# Patient Record
Sex: Female | Born: 1940 | Race: White | Hispanic: No | Smoking: Never smoker
Health system: Southern US, Community
[De-identification: ages and names within clinical notes are randomized; demographics above are authoritative.]

## PROBLEM LIST (undated history)

## (undated) DIAGNOSIS — T8859XA Other complications of anesthesia, initial encounter: Secondary | ICD-10-CM

## (undated) DIAGNOSIS — K5792 Diverticulitis of intestine, part unspecified, without perforation or abscess without bleeding: Secondary | ICD-10-CM

## (undated) DIAGNOSIS — I10 Essential (primary) hypertension: Secondary | ICD-10-CM

## (undated) DIAGNOSIS — T4145XA Adverse effect of unspecified anesthetic, initial encounter: Secondary | ICD-10-CM

## (undated) DIAGNOSIS — K219 Gastro-esophageal reflux disease without esophagitis: Secondary | ICD-10-CM

## (undated) DIAGNOSIS — E119 Type 2 diabetes mellitus without complications: Secondary | ICD-10-CM

## (undated) DIAGNOSIS — N189 Chronic kidney disease, unspecified: Secondary | ICD-10-CM

## (undated) DIAGNOSIS — E785 Hyperlipidemia, unspecified: Secondary | ICD-10-CM

## (undated) DIAGNOSIS — I340 Nonrheumatic mitral (valve) insufficiency: Secondary | ICD-10-CM

## (undated) DIAGNOSIS — I2510 Atherosclerotic heart disease of native coronary artery without angina pectoris: Secondary | ICD-10-CM

## (undated) DIAGNOSIS — R001 Bradycardia, unspecified: Secondary | ICD-10-CM

## (undated) DIAGNOSIS — I2699 Other pulmonary embolism without acute cor pulmonale: Secondary | ICD-10-CM

## (undated) DIAGNOSIS — I4891 Unspecified atrial fibrillation: Secondary | ICD-10-CM

## (undated) HISTORY — PX: ABDOMINAL HYSTERECTOMY: SHX81

## (undated) HISTORY — PX: CARDIAC CATHETERIZATION: SHX172

## (undated) HISTORY — PX: EYE SURGERY: SHX253

## (undated) HISTORY — PX: CORONARY ARTERY BYPASS GRAFT: SHX141

## (undated) HISTORY — PX: BREAST BIOPSY: SHX20

## (undated) HISTORY — PX: CARDIAC SURGERY: SHX584

## (undated) HISTORY — PX: BREAST EXCISIONAL BIOPSY: SUR124

## (undated) HISTORY — PX: CATARACT EXTRACTION W/ INTRAOCULAR LENS  IMPLANT, BILATERAL: SHX1307

## (undated) HISTORY — PX: CHOLECYSTECTOMY: SHX55

---

## 2003-10-16 ENCOUNTER — Other Ambulatory Visit: Payer: Self-pay

## 2003-10-27 ENCOUNTER — Other Ambulatory Visit: Payer: Self-pay

## 2004-11-20 ENCOUNTER — Ambulatory Visit: Payer: Self-pay | Admitting: Internal Medicine

## 2006-01-10 ENCOUNTER — Ambulatory Visit: Payer: Self-pay | Admitting: Internal Medicine

## 2006-01-17 ENCOUNTER — Ambulatory Visit: Payer: Self-pay | Admitting: Internal Medicine

## 2006-07-22 ENCOUNTER — Ambulatory Visit: Payer: Self-pay | Admitting: Internal Medicine

## 2006-10-23 ENCOUNTER — Emergency Department: Payer: Self-pay | Admitting: Emergency Medicine

## 2006-10-23 ENCOUNTER — Other Ambulatory Visit: Payer: Self-pay

## 2007-02-18 ENCOUNTER — Ambulatory Visit: Payer: Self-pay | Admitting: Internal Medicine

## 2008-02-19 ENCOUNTER — Ambulatory Visit: Payer: Self-pay | Admitting: Internal Medicine

## 2008-06-05 IMAGING — CR DG CHEST 2V
1 series · 2 of 2 positions shown · non-contrast
Comparison: none

REASON FOR EXAM: rapid heart rate  rm 15
COMMENTS:

PROCEDURE:     DXR - DXR CHEST PA (OR AP) AND LATERAL  - October 23, 2006 [DATE]
RESULT:     The lungs are adequately inflated. There is no focal infiltrate.
The heart and pulmonary vascularity are within the limits of normal. There
is no pleural effusion.

[Series 1: view not recorded · 0.17mm/px · 2 of 2 slices shown]
[im 1/2]
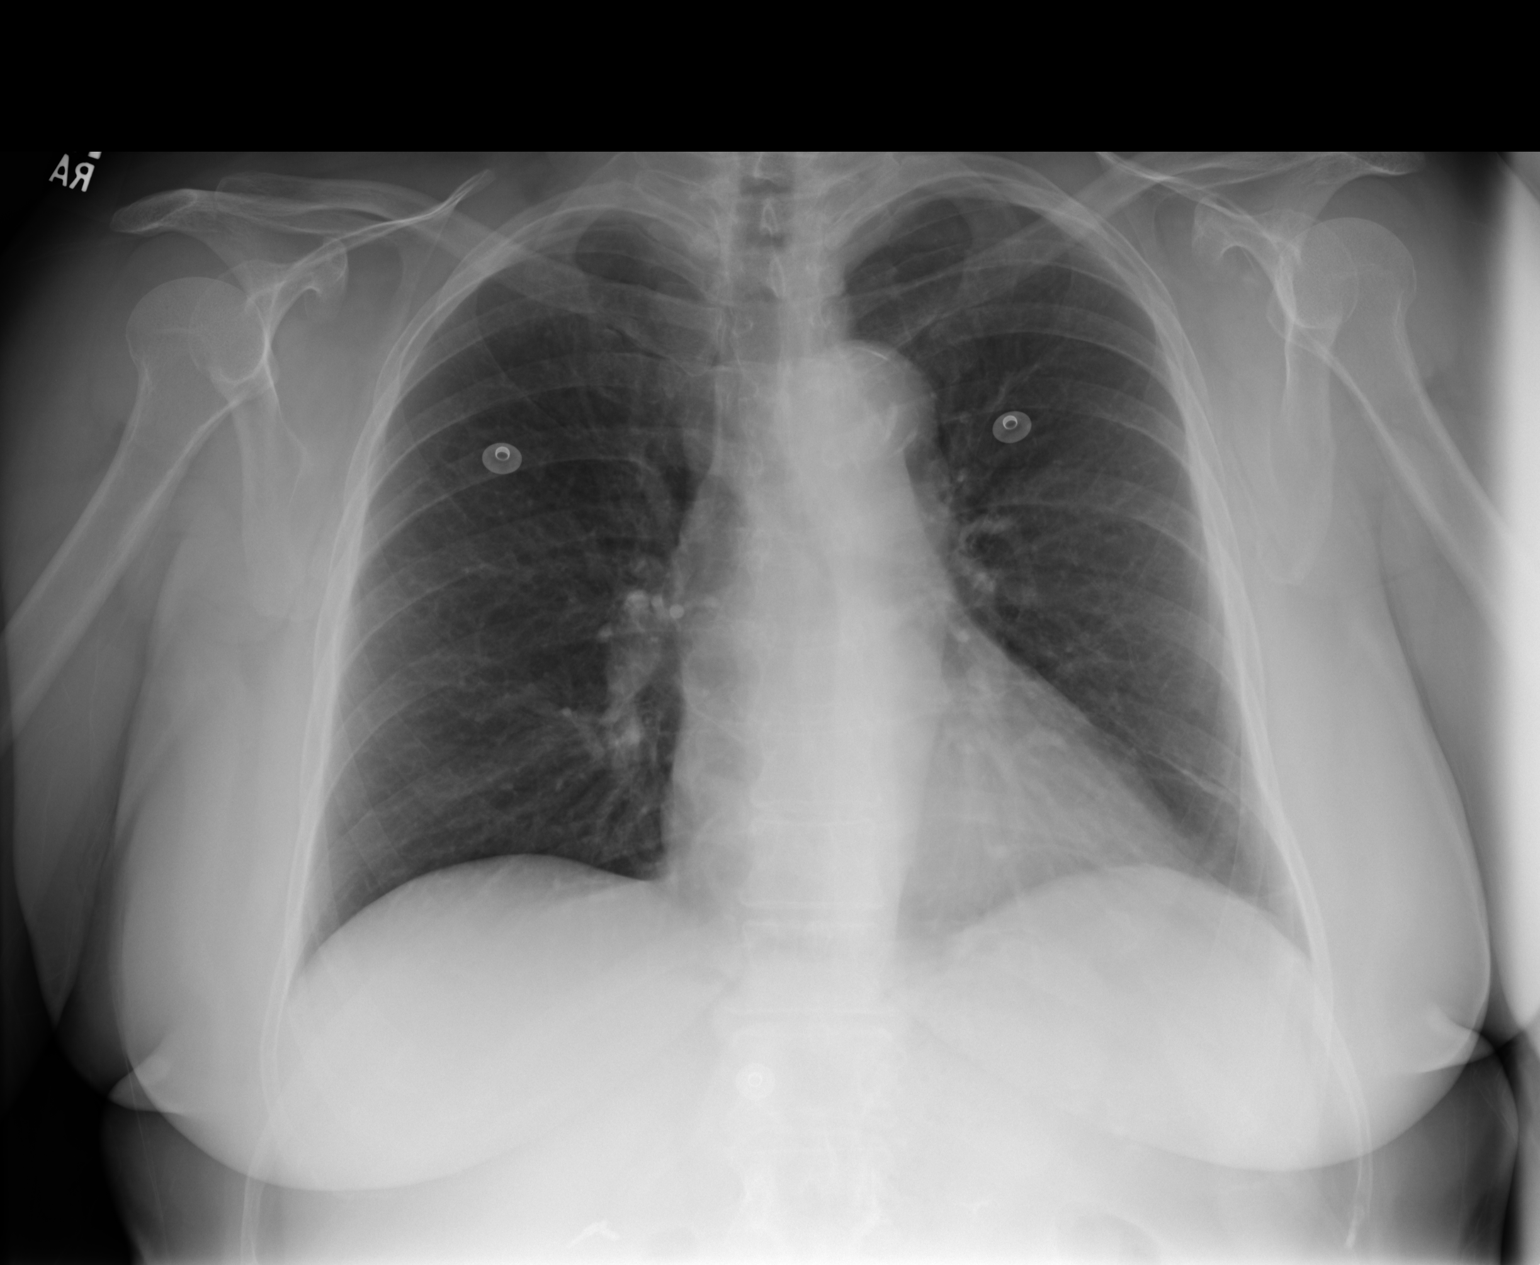
[im 2/2]
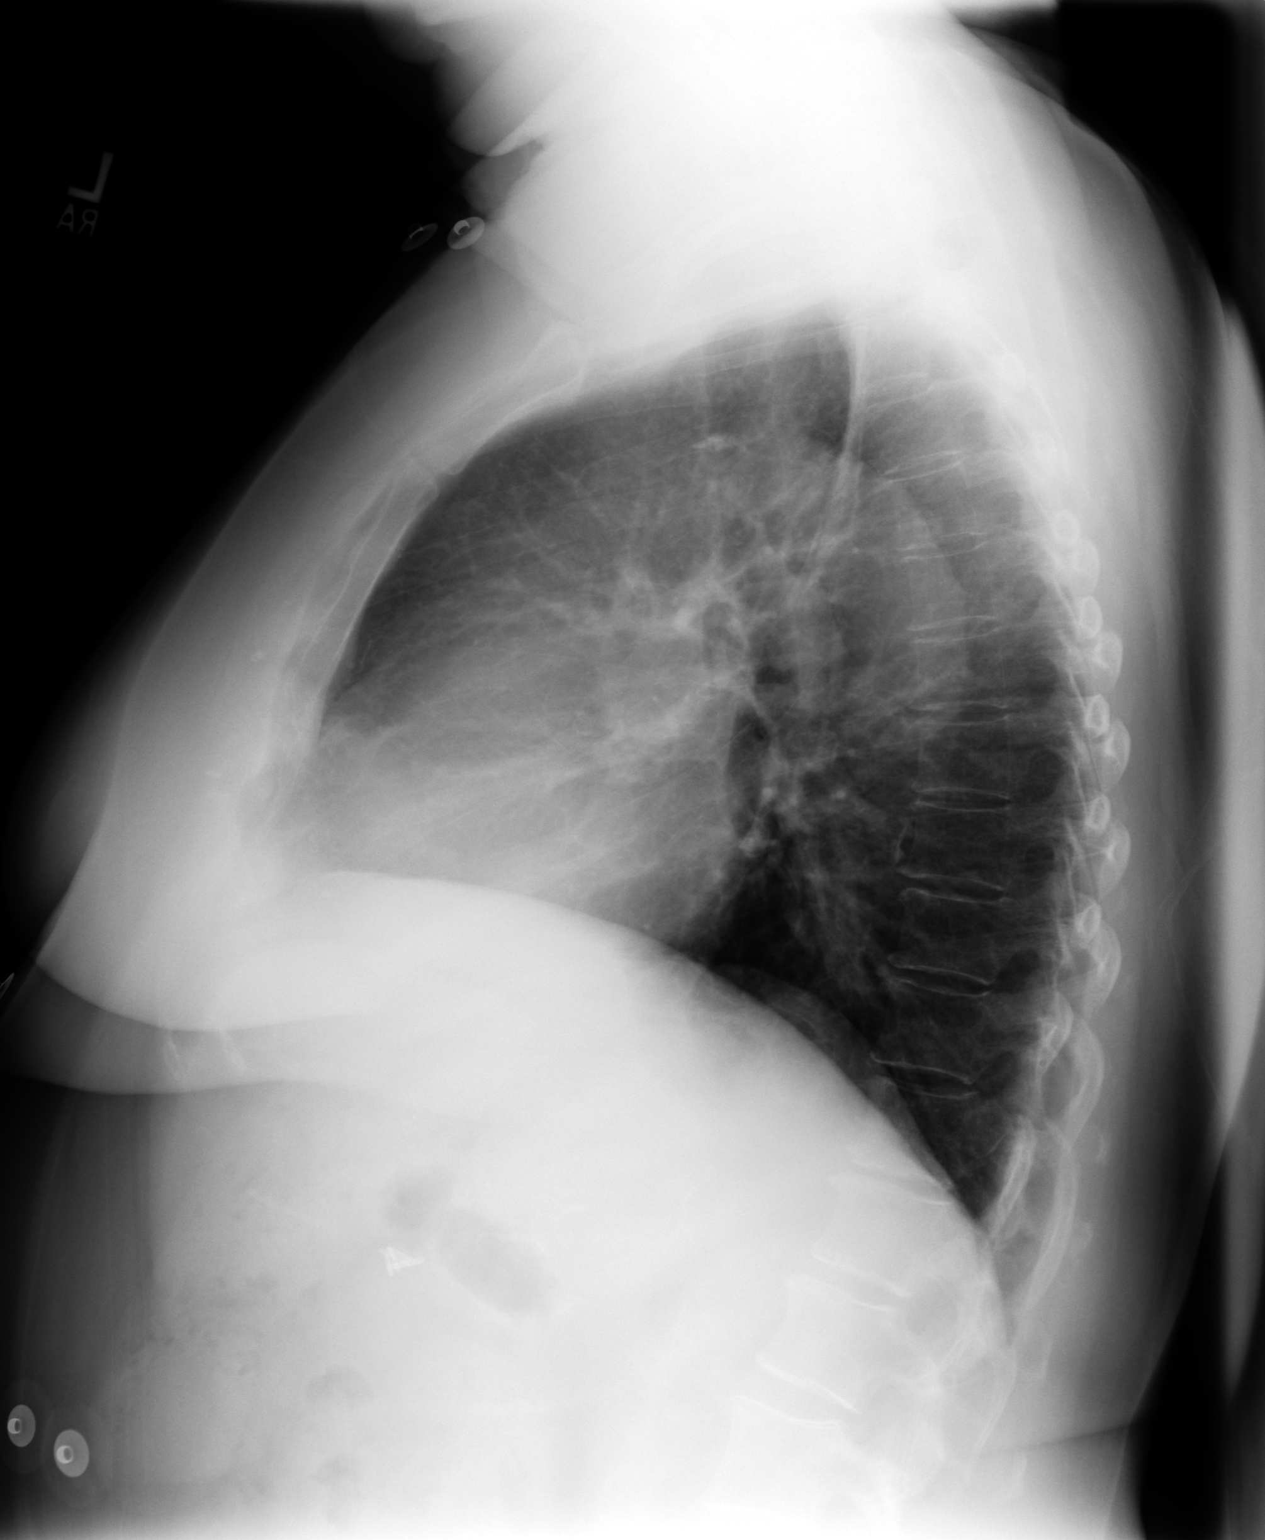

[2 of 2 positions shown; findings below may reference images not displayed]

IMPRESSION: 1)I do not see evidence of acute cardiopulmonary disease.  There is no overt
evidence of CHF.

## 2009-02-24 ENCOUNTER — Ambulatory Visit: Payer: Self-pay | Admitting: Internal Medicine

## 2010-02-27 ENCOUNTER — Ambulatory Visit: Payer: Self-pay | Admitting: Internal Medicine

## 2010-09-17 DIAGNOSIS — I2699 Other pulmonary embolism without acute cor pulmonale: Secondary | ICD-10-CM

## 2010-09-17 HISTORY — DX: Other pulmonary embolism without acute cor pulmonale: I26.99

## 2011-07-23 ENCOUNTER — Inpatient Hospital Stay: Payer: Self-pay | Admitting: Internal Medicine

## 2011-08-10 HISTORY — PX: CORONARY ARTERY BYPASS GRAFT: SHX141

## 2011-09-19 ENCOUNTER — Encounter: Payer: Self-pay | Admitting: Internal Medicine

## 2011-10-19 ENCOUNTER — Encounter: Payer: Self-pay | Admitting: Internal Medicine

## 2011-11-16 ENCOUNTER — Encounter: Payer: Self-pay | Admitting: Internal Medicine

## 2012-03-02 ENCOUNTER — Inpatient Hospital Stay: Payer: Self-pay | Admitting: Internal Medicine

## 2012-03-02 LAB — COMPREHENSIVE METABOLIC PANEL
Albumin: 3.3 g/dL — ABNORMAL LOW (ref 3.4–5.0)
Alkaline Phosphatase: 75 U/L (ref 50–136)
Anion Gap: 10 (ref 7–16)
Chloride: 106 mmol/L (ref 98–107)
Creatinine: 0.54 mg/dL — ABNORMAL LOW (ref 0.60–1.30)
EGFR (African American): >60
EGFR (Non-African Amer.): >60
Glucose: 107 mg/dL — ABNORMAL HIGH (ref 65–99)
Osmolality: 281 (ref 275–301)
SGPT (ALT): 17 U/L
Sodium: 141 mmol/L (ref 136–145)

## 2012-03-02 LAB — PROTIME-INR: INR: 1.5

## 2012-03-02 LAB — APTT: Activated PTT: 40.9 secs — ABNORMAL HIGH (ref 23.6–35.9)

## 2012-03-02 LAB — CBC: RBC: 3.77 10*6/uL — ABNORMAL LOW (ref 3.80–5.20)

## 2012-03-02 LAB — TROPONIN I: Troponin-I: <0.02 ng/mL

## 2012-03-03 ENCOUNTER — Observation Stay: Payer: Self-pay | Admitting: Internal Medicine

## 2012-03-03 LAB — CBC
HCT: 36.8 % (ref 35.0–47.0)
HGB: 12.7 g/dL (ref 12.0–16.0)
MCH: 32.5 pg (ref 26.0–34.0)
MCHC: 34.5 g/dL (ref 32.0–36.0)
MCV: 94 fL (ref 80–100)
Platelet: 235 10*3/uL (ref 150–440)
RBC: 3.92 10*6/uL (ref 3.80–5.20)
RDW: 12.9 % (ref 11.5–14.5)
WBC: 6.3 10*3/uL (ref 3.6–11.0)

## 2012-03-03 LAB — COMPREHENSIVE METABOLIC PANEL
BUN: 11 mg/dL (ref 7–18)
Bilirubin,Total: 0.8 mg/dL (ref 0.2–1.0)
Calcium, Total: 8.6 mg/dL (ref 8.5–10.1)
Co2: 24 mmol/L (ref 21–32)
EGFR (Non-African Amer.): >60
Osmolality: 284 (ref 275–301)
SGPT (ALT): 20 U/L

## 2012-03-03 LAB — CK TOTAL AND CKMB (NOT AT ARMC): CK, Total: 41 U/L (ref 21–215)

## 2012-03-03 LAB — MAGNESIUM: Magnesium: 1.9 mg/dL

## 2012-03-03 LAB — PROTIME-INR: Prothrombin Time: 19.9 secs — ABNORMAL HIGH (ref 11.5–14.7)

## 2012-03-03 LAB — TROPONIN I: Troponin-I: <0.02 ng/mL

## 2012-03-04 DIAGNOSIS — I4891 Unspecified atrial fibrillation: Secondary | ICD-10-CM

## 2012-03-04 LAB — URINALYSIS, COMPLETE
Blood: NEGATIVE
Glucose,UR: NEGATIVE mg/dL (ref 0–75)
Nitrite: NEGATIVE
Ph: 7 (ref 4.5–8.0)
Specific Gravity: 1.009 (ref 1.003–1.030)
Squamous Epithelial: 1
WBC UR: 1 /HPF (ref 0–5)

## 2012-03-04 LAB — BASIC METABOLIC PANEL
Anion Gap: 7 (ref 7–16)
Chloride: 111 mmol/L — ABNORMAL HIGH (ref 98–107)
Co2: 24 mmol/L (ref 21–32)
Creatinine: 0.66 mg/dL (ref 0.60–1.30)
EGFR (Non-African Amer.): >60
Glucose: 107 mg/dL — ABNORMAL HIGH (ref 65–99)
Osmolality: 284 (ref 275–301)
Sodium: 142 mmol/L (ref 136–145)

## 2012-03-04 LAB — CBC WITH DIFFERENTIAL/PLATELET
Basophil #: 0.1 10*3/uL (ref 0.0–0.1)
Eosinophil %: 2.3 %
HCT: 34.2 % — ABNORMAL LOW (ref 35.0–47.0)
Lymphocyte #: 1.9 10*3/uL (ref 1.0–3.6)
MCH: 31.1 pg (ref 26.0–34.0)
Monocyte #: 0.7 x10 3/mm (ref 0.2–0.9)
Monocyte %: 10.2 %
Neutrophil %: 56.4 %
Platelet: 241 10*3/uL (ref 150–440)
RBC: 3.7 10*6/uL — ABNORMAL LOW (ref 3.80–5.20)
RDW: 13.2 % (ref 11.5–14.5)

## 2012-03-04 LAB — CK TOTAL AND CKMB (NOT AT ARMC)
CK, Total: 30 U/L (ref 21–215)
CK-MB: 0.7 ng/mL (ref 0.5–3.6)

## 2012-03-04 LAB — PROTIME-INR: Prothrombin Time: 23.6 secs — ABNORMAL HIGH (ref 11.5–14.7)

## 2012-03-19 ENCOUNTER — Ambulatory Visit: Payer: Self-pay | Admitting: Family Medicine

## 2012-04-02 DIAGNOSIS — I4891 Unspecified atrial fibrillation: Secondary | ICD-10-CM

## 2013-03-05 IMAGING — CR DG CHEST 1V PORT
1 series · 1 of 1 positions shown · non-contrast
Comparison: none

REASON FOR EXAM: cp
COMMENTS:

[view not recorded]
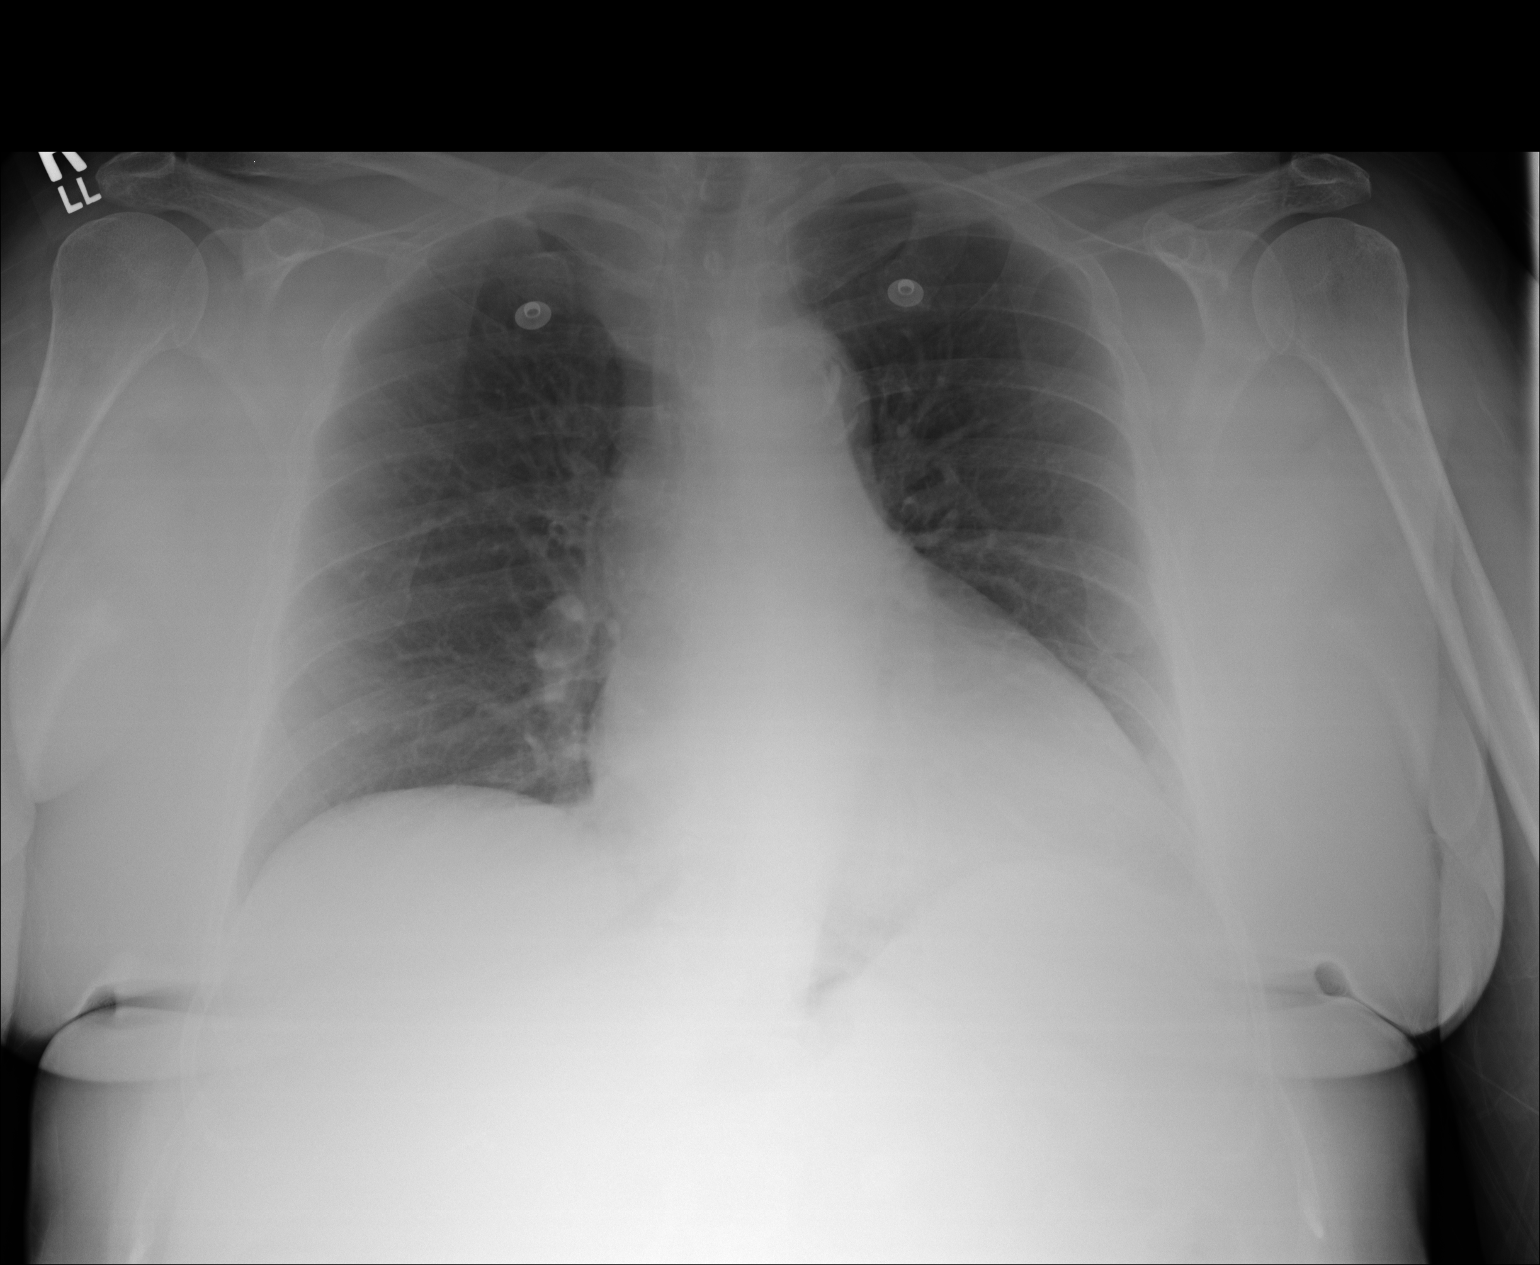

[1 of 1 positions shown; findings below may reference images not displayed]

PROCEDURE:     DXR - DXR PORTABLE CHEST SINGLE VIEW  - July 23, 2011  [DATE]

RESULT:     A lordotic projection is performed.

The lungs are clear. The heart and pulmonary vessels are normal. The bony
and mediastinal structures are unremarkable. There is no effusion. There is
no pneumothorax or evidence of congestive failure.
IMPRESSION: No acute cardiopulmonary disease..

## 2013-06-03 ENCOUNTER — Ambulatory Visit: Payer: Self-pay | Admitting: Family Medicine

## 2014-01-12 ENCOUNTER — Emergency Department: Payer: Self-pay | Admitting: Emergency Medicine

## 2014-01-12 LAB — COMPREHENSIVE METABOLIC PANEL
ALT: 26 U/L (ref 12–78)
Albumin: 3.7 g/dL (ref 3.4–5.0)
Alkaline Phosphatase: 71 U/L
Anion Gap: 8 (ref 7–16)
BUN: 14 mg/dL (ref 7–18)
Bilirubin,Total: 1.6 mg/dL — ABNORMAL HIGH (ref 0.2–1.0)
CALCIUM: 9.4 mg/dL (ref 8.5–10.1)
Chloride: 100 mmol/L (ref 98–107)
Co2: 30 mmol/L (ref 21–32)
Creatinine: 0.78 mg/dL (ref 0.60–1.30)
EGFR (African American): >60
GLUCOSE: 182 mg/dL — AB (ref 65–99)
Osmolality: 281 (ref 275–301)
POTASSIUM: 2.9 mmol/L — AB (ref 3.5–5.1)
SGOT(AST): 14 U/L — ABNORMAL LOW (ref 15–37)
Sodium: 138 mmol/L (ref 136–145)
TOTAL PROTEIN: 7.3 g/dL (ref 6.4–8.2)

## 2014-01-12 LAB — TROPONIN I
Troponin-I: <0.02 ng/mL
Troponin-I: <0.02 ng/mL

## 2014-01-12 LAB — CBC
HCT: 41.7 % (ref 35.0–47.0)
HGB: 14.3 g/dL (ref 12.0–16.0)
MCH: 32.2 pg (ref 26.0–34.0)
MCHC: 34.4 g/dL (ref 32.0–36.0)
MCV: 94 fL (ref 80–100)
PLATELETS: 204 10*3/uL (ref 150–440)
RBC: 4.45 10*6/uL (ref 3.80–5.20)
RDW: 13.8 % (ref 11.5–14.5)
WBC: 10.2 10*3/uL (ref 3.6–11.0)

## 2014-01-12 LAB — CK TOTAL AND CKMB (NOT AT ARMC)
CK, Total: 99 U/L
CK-MB: 1.1 ng/mL (ref 0.5–3.6)

## 2014-01-12 LAB — PROTIME-INR
INR: 1.7
PROTHROMBIN TIME: 20 s — AB (ref 11.5–14.7)

## 2014-06-29 ENCOUNTER — Ambulatory Visit: Payer: Self-pay | Admitting: Family Medicine

## 2014-10-02 ENCOUNTER — Emergency Department: Payer: Self-pay | Admitting: Emergency Medicine

## 2014-10-02 ENCOUNTER — Ambulatory Visit: Payer: Self-pay | Admitting: Family Medicine

## 2014-10-02 LAB — CBC
HCT: 41.3 % (ref 35.0–47.0)
HGB: 13.8 g/dL (ref 12.0–16.0)
MCH: 31.5 pg (ref 26.0–34.0)
MCHC: 33.5 g/dL (ref 32.0–36.0)
MCV: 94 fL (ref 80–100)
Platelet: 226 10*3/uL (ref 150–440)
RBC: 4.38 10*6/uL (ref 3.80–5.20)
RDW: 13.3 % (ref 11.5–14.5)
WBC: 7.5 10*3/uL (ref 3.6–11.0)

## 2014-10-02 LAB — PROTIME-INR
INR: 1.7
Prothrombin Time: 19.5 secs — ABNORMAL HIGH (ref 11.5–14.7)

## 2014-10-02 LAB — COMPREHENSIVE METABOLIC PANEL
ALBUMIN: 3.3 g/dL — AB (ref 3.4–5.0)
ANION GAP: 13 (ref 7–16)
Alkaline Phosphatase: 58 U/L
BILIRUBIN TOTAL: 0.9 mg/dL (ref 0.2–1.0)
BUN: 15 mg/dL (ref 7–18)
CHLORIDE: 103 mmol/L (ref 98–107)
CREATININE: 0.85 mg/dL (ref 0.60–1.30)
Calcium, Total: 8.9 mg/dL (ref 8.5–10.1)
Co2: 27 mmol/L (ref 21–32)
EGFR (African American): >60
EGFR (Non-African Amer.): >60
GLUCOSE: 145 mg/dL — AB (ref 65–99)
OSMOLALITY: 288 (ref 275–301)
POTASSIUM: 3.1 mmol/L — AB (ref 3.5–5.1)
SGOT(AST): 31 U/L (ref 15–37)
SGPT (ALT): 25 U/L
SODIUM: 143 mmol/L (ref 136–145)
Total Protein: 6.7 g/dL (ref 6.4–8.2)

## 2014-10-02 LAB — TROPONIN I

## 2015-01-09 NOTE — H&P (Signed)
PATIENT NAME:  [name], KILCREASE MR#:  [id number] DATE OF BIRTH:  1940-12-30  DATE OF ADMISSION:  03/04/2012  PRIMARY CARE PHYSICIAN: Dr. Hortencia Pilar REQUESTING PHYSICIAN:  Dr. Jasmine December   CHIEF COMPLAINT: Palpitations.   HISTORY OF PRESENT ILLNESS: The patient is a 74 year old female with a known history of hypertension, diabetes, and paroxysmal atrial fibrillation who is being admitted for rapid atrial fibrillation and palpitations. The patient was admitted yesterday for the same and was discharged today as she was stable enough, but as soon as she went home she felt excited and at the same time she started feeling palpitations. She took her afternoon medication in the form of metoprolol and warfarin as she was prescribed but it did not help. She could see her heart jumping out of her chest and was feeling continuous palpitations. She tried to contact Dr. Alveria Apley office but was unable to do so. The family was feeling uncomfortable keeping her home and called the Emergency Department as she tried to call admissions who referred her to the Emergency Department.  While in the ED, she was found to have potassium of 3.2 and she is being admitted for further evaluation and management as she did not feel safe to go back home like this.   PAST MEDICAL HISTORY:  1. Hypertension.  2. Diabetes.  3. Coronary artery disease, status post coronary artery bypass graft in November 2012.  4. Hyperlipidemia.  5. Paroxysmal atrial fibrillation.  6. 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   PAST SURGICAL HISTORY:  Hysterectomy.   FAMILY HISTORY: Mother had congestive heart failure, died at the age of  14. She also suffered from a stroke at age of 102. Father had valve replacement and coronary artery bypass graft.   SOCIAL HISTORY: She lives at home with her son. Her  husband resides in a local nursing home. She is a nonsmoker. No alcohol or drug use.   MEDICATIONS AT HOME: From recent discharge this morning are as follows:  1. Amlodipine 5 mg p.o. daily.  2. Aspirin 325 mg p.o. daily.  3. Multivitamin once daily.  4. Omega-3 fatty acid 2 tablets p.o. twice a day.  5. Quinapril 40 mg p.o. daily.  6. Metformin 500 mg p.o. daily.  7. Pravastatin 10 mg p.o. at bedtime.  8. Senokot once daily.  9. Metoprolol 50 mg p.o. b.i.d.  10. Warfarin 6 mg p.o. at bedtime.   REVIEW OF SYSTEMS: CONSTITUTIONAL: No fever, fatigue, weakness. EYES: No blurred or double vision. ENT: No tinnitus or ear pain. RESPIRATORY:  No cough, wheeze, or hemoptysis; CARDIOVASCULAR:  No chest pain, orthopnea, or edema. Positive for palpitations and atrial fibrillation. GI: No nausea, vomiting, or diarrhea. GU: No dysuria or hematuria. ENDOCRINE:  No polyuria or nocturia. HEME: No anemia or easy bruising. SKIN: No rash or lesion. MUSCULOSKELETAL: No arthritis or muscle cramps. NEUROLOGIC: No tingling, numbness, or weakness. PSYCHIATRIC: Positive for anxiety. No depression.   PHYSICAL EXAMINATION:  VITAL SIGNS: Temperature 97.8, heart rate 75 per minute, respirations 18 per minute, blood pressure 143/77 mmHg. She is saturating 98% on room air.   GENERAL:  The patient is a 74 year old female lying in the bed comfortably without any acute distress.   EYES: Pupils equal, round, reactive to light and accommodation. No scleral icterus. Extraocular muscles intact.   HENT: Head atraumatic, normocephalic. Oropharynx and nasopharynx clear.  NECK: Supple. No jugular venous distention. No thyroid enlargement or tenderness.   LUNGS: Clear to auscultation bilaterally. No wheezes, rales, rhonchi, or crepitation.   CARDIOVASCULAR: S1, S2 normal. No murmur, rubs, or gallop.  Chest wall shows coronary artery bypass grafting scar well healed.  ABDOMEN: Soft, nontender, nondistended. Bowel sounds  present. No organomegaly or masses.    EXTREMITIES: No pedal edema, cyanosis, or clubbing.   NEUROLOGIC: Nonfocal examination. Cranial nerves III through XII intact. Muscle strength 5/5. Extremity sensation intact.   PSYCH: The patient is oriented to time, place, and person.   SKIN: No obvious rash, lesion, or ulcer.  LABORATORY, DIAGNOSTIC, AND RADIOLOGICAL DATA: Normal BMP except potassium of 3.2. Normal liver function tests. Normal first set of cardiac enzymes. Normal TSH. Normal CBC. PT of 19.9, INR 1.6.   Earlier EKG showed heart rate of 140 with rapid atrial fibrillation from EMS. EKG here in the Emergency Department is showing normal sinus rhythm with a rate of 63 beats per minute. She converted herself on arrival.   IMPRESSION AND PLAN:  1. Rapid paroxysmal atrial fibrillation: Now rate is well controlled. She is back in normal sinus rhythm, but she does not feel comfortable going back home nor does her family, who feels it would be wise to observe her here.  She will be admitted for observation for now. We will continue metoprolol and Coumadin at current dosage. Consult cardiology for any further suggestions.   2. Diabetes: We will continue home medications. 3. Hypertension: Blood pressure is well controlled. We will continue home medication.  4. Hypokalemia: We will replete and recheck. The patient is already feeling somewhat better with potassium being replaced. We will recheck in the morning.  5. CODE STATUS: FULL CODE.       TOTAL TIME TAKING CARE OF THIS PATIENT: 45 minutes.     ____________________________ Lucina Mellow. Manuella Ghazi, MD vss:bjt D: 03/03/2012 23:10:01 ET T: 03/04/2012 09:07:33 ET JOB#: [id number]  cc: Darris Carachure S. Manuella Ghazi, MD, <Dictator> Kerin Perna, MD Corey Skains, MD Lucina Mellow [facility] MD ELECTRONICALLY SIGNED 03/04/2012 20:06

## 2015-01-09 NOTE — Discharge Summary (Signed)
PATIENT NAME:  [name], [name] MR#:  [id number] DATE OF BIRTH:  [birth year]  DATE OF ADMISSION:  03/03/2012 DATE OF DISCHARGE:  03/04/2012  PRESENTING COMPLAINT:  Palpitations.  DISCHARGE DIAGNOSES:  1. Recurrent atrial fibrillation, now in sinus rhythm, with history of paroxysmal atrial fibrillation, on Coumadin.  2. Hypertension.   CONDITION ON DISCHARGE: Fair. The patient is in sinus rhythm.   DISCHARGE MEDICATIONS:  1. Amlodipine 5 mg daily.  2. Metoprolol 50 mg twice a day. 3. Coumadin 6 mg orally daily.  4. Senokot 8.6 mg p.o. daily at bedtime.  5. Pravastatin 10 mg daily at bedtime.  6. Metformin 500 mg p.o. daily.  7. Quinapril 400 mg daily.  8. Omega-3 fatty acids 2 tablets twice a day. 9. Aspirin 325 mg daily.  10. Multivitamin p.o. daily.   DISCHARGE FOLLOWUP: Followup with Dr. Nehemiah Massed and Dr. Hoy Morn on your scheduled appointment. Get your PT and INR checked with your primary care on 03/06/2012, which is Thursday.   LABS: CBC within normal limits, except hemoglobin and hematocrit of 11.5 and 34.2. Comprehensive metabolic panel within normal limits. Cardiac enzymes remained negative. PT and INR is 23.6 and 2.1.   Urinalysis negative for urinary tract infection.   CONSULTANTS: Serafina Royals, MD - Cardiology.  BRIEF SUMMARY OF HOSPITAL COURSE:  1. [name] is a pleasant 74 year old Caucasian female with history of paroxysmal atrial fibrillation who was just discharged, however, she came back the same day with palpitations and chest pressure in the setting of rapid atrial fibrillation. She received IV Cardizem in the ER which resolved. Her cardiac enzymes were negative. She remained in sinus rhythm. Thereafter she was continued on metoprolol which was recently increased to 50 mg twice a day. She was seen by Dr. Nehemiah Massed and recommended to continue her metoprolol and Coumadin and see him as an outpatient. 2. Coronary artery disease status post coronary artery bypass  graft in November 2012. The patient denied any chest pain. Cardiac enzymes remained negative.  3. Hypertension. She was continued on beta blockers and amlodipine.  4. Type 2 diabetes. Metformin was continued.  5. History of pulmonary embolus with chronic anticoagulation, on Coumadin. Her dosage was  adjusted recently. She will follow-up with her primary care to get her PT-INR checked.   Her hospital stay otherwise remained stable.   TIME SPENT: 40 minutes. ____________________________ Hart Rochester Posey Pronto, MD sap:slb D: 03/05/2012 14:20:48 ET T: 03/05/2012 15:17:14 ET JOB#: [id number]  cc: Hiba Garry A. Posey Pronto, MD, <Dictator> Kerin Perna, MD Ilda Basset MD ELECTRONICALLY SIGNED 03/08/2012 15:46

## 2015-01-09 NOTE — Consult Note (Signed)
PATIENT NAME:  [name], [name] MR#:  [id number] DATE OF BIRTH:  10-08-1940  DATE OF CONSULTATION:  03/03/2012  REFERRING PHYSICIAN:  Dr. Doy Hutching  CONSULTING PHYSICIAN:  Corey Skains, MD  PRIMARY CARE PHYSICIAN:  Dr. Doy Hutching  REASON FOR CONSULTATION: Atrial fibrillation, coronary artery disease, hyperlipidemia, hypertension, with pulmonary embolism and shortness of breath.   CHIEF COMPLAINT: "I got really short of breath."   HISTORY OF PRESENT ILLNESS: This is a 74 year old female with known coronary disease status post coronary artery bypass graft due to significant three-vessel coronary artery disease in 07/2011. The patient did well with her coronary artery bypass graft and had no other significant heart failure at that time. The patient has had postoperative atrial fibrillation for which she was placed on beta blocker and has done fairly well. In addition to that, the patient did have a pulmonary embolism and was placed on anticoagulation for further risk reduction in pulmonary embolism and remains on Coumadin at this time. The patient's diabetes mellitus, hypertension, and hyperlipidemia have been fairly well controlled. Recently the patient has had new onset of symptoms of palpitations as well as neck discomfort and fluttering as well as shortness of breath. This has occurred off and on in the last several days. When seen in the Emergency Room, she had normal sinus rhythm, pre-atrial contractions by EKG, and had a run of atrial fibrillation with rapid ventricular rate, which spontaneously converted to normal sinus rhythm. Since then her beta blocker has been changed to a slightly higher dose. She has continued anticoagulation with Coumadin for further risk reduction in stroke. She has not had any heart failure type symptoms or true angina. Troponin and CK-MB are within normal limits.   REVIEW OF SYSTEMS: The remainder of review of systems is negative for vision change, ringing in the ears,  hearing loss, cough, congestion, heartburn, nausea, vomiting, diarrhea, bloody stools, stomach pain, extremity pain, leg weakness, cramping of the buttocks, known blood clots, headaches, blackouts, dizzy spells, nosebleeds, congestion, trouble swallowing, frequent urination, urination at night.   PAST MEDICAL HISTORY:  1. Hypertension.  2. Hyperlipidemia.  3. Diabetes mellitus.  4. Valvular heart disease. 5. Pulmonary embolism.  6. Coronary artery disease.  7. Atrial fibrillation.   FAMILY HISTORY: No family members with early onset of cardiovascular disease or hypertension.   SOCIAL HISTORY: Currently denies alcohol or tobacco use.   ALLERGIES: No known drug allergies.   CURRENT MEDICATIONS: As listed.   PHYSICAL EXAMINATION:  VITAL SIGNS: Blood pressure 136/68 bilaterally, heart rate 72 upright, reclining, and regular.   GENERAL: She is a well-appearing female in no acute distress.   HEENT: No icterus, thyromegaly, ulcers, hemorrhage, or xanthelasma.   HEART: Regular rate and rhythm with normal S1 and S2 without murmur, gallop, or rub. Point of maximal impulse is normal size and placement. Carotid upstroke normal without bruit. Jugular venous pressure is normal.   LUNGS: Lungs have few basilar crackles with normal respirations.   ABDOMEN: Soft, nontender, without hepatosplenomegaly or masses. Abdominal aorta is normal size without bruit.   EXTREMITIES: 2+ bilateral pulses in dorsal, pedal, radial, and femoral arteries without lower extremity edema, cyanosis, clubbing, or ulcers.   NEUROLOGIC: She is oriented to time, place, and person with normal mood and affect.   ASSESSMENT: This is a 75 year old female with coronary artery disease, hypertension, hyperlipidemia, diabetes mellitus, history of atrial fibrillation and pulmonary embolism with a new onset of atrial fibrillation with rapid ventricular rate and no current evidence of myocardial  infarction.   RECOMMENDATIONS:   1. Increase beta blocker for maintenance of normal rhythm and continuation of control of heart rate if atrial fibrillation recurs.  2. Anticoagulation with Coumadin with a goal INR between 2 and 3. 3. Consider echocardiogram for LV systolic dysfunction, valvular heart disease, and adjustments of medications thereof as necessary.  4. Continue lipid management with a goal LDL below 70 mg/dL with current medical regimen.  5. Other hypertension control with ACE inhibitor for further risk reduction and renal protection.  6. Ambulate and adjustments of medications as necessary.  7. No further cardiac diagnostics other than listed above at this time are necessary.    ____________________________ Corey Skains, MD bjk:bjt D: 03/03/2012 07:35:10 ET T: 03/03/2012 09:09:43 ET JOB#: [id number]  cc: Corey Skains, MD, <Dictator> Corey Skains MD ELECTRONICALLY SIGNED 03/05/2012 10:40

## 2015-01-09 NOTE — H&P (Signed)
PATIENT NAME:  [name], [name] MR#:  [id number] DATE OF BIRTH:  1941-09-13  DATE OF ADMISSION:  03/02/2012  PRIMARY CARE PHYSICIAN: Hortencia Pilar, MD  PRIMARY CARDIOLOGIST: Serafina Royals, MD  CHIEF COMPLAINT: Chest pain and recurrent palpitations.   HISTORY OF PRESENT ILLNESS: [name] is a 74 year old Caucasian female with history of coronary artery disease status post coronary artery bypass graft in November 2012 and history of paroxysmal atrial fibrillation. The patient was in her usual state of health until today, in the afternoon, when she experienced midsternal chest pain in the upper part of the mid chest described as a pressure-like feeling. The severity was 6 to 7 on a scale of 10 relieved by resting, but then the pain would recur. She also described occasional palpitations similar to her atrial fibrillation. The patient came to the emergency department for further evaluation of her recurrent chest pain. She denies having syncope or near syncope, no nausea, no sweating, and no vomiting.   REVIEW OF SYSTEMS: CONSTITUTIONAL: Denies having any fever. No chills. No fatigue. EYES: No blurring of vision. No double vision. ENT: No hearing impairment. No sore throat. No dysphagia. CARDIOVASCULAR: Reports chest pain. No shortness of breath. No edema. No syncope. She has palpitations, these are recurrent. RESPIRATORY: No shortness of breath. No cough. No sputum production. No hemoptysis. GASTROINTESTINAL: No abdominal pain. No vomiting. No diarrhea. GENITOURINARY: No dysuria. No frequency of urination. MUSCULOSKELETAL: No joint pain or swelling. No muscular pain or swelling. INTEGUMENTARY: No skin rash. No ulcers. NEUROLOGY: No focal weakness. No seizure activity. No headache. PSYCHIATRY: No anxiety. No depression. ENDOCRINE: No polyuria or polydipsia. No heat or cold intolerance.   PAST MEDICAL HISTORY:  1. Coronary artery disease status post coronary artery bypass graft in November 2012, done  at [facility].  2. History of paroxysmal atrial fibrillation.  3. Hyperlipidemia.  4. Type 2 diabetes mellitus, non-insulin-dependent  5. Systemic hypertension.  6. History of deep venous thrombosis, maintained on chronic anticoagulation with Coumadin.  PAST SURGICAL HISTORY: Hysterectomy.   FAMILY HISTORY: Her mother suffered from congestive heart failure and died at the age of 16. She also suffered from stroke at age of 53. Her father had valve replacement and coronary artery bypass.   SOCIAL HISTORY: She lives at home with her son. Her husband resides in a local nursing home.   SOCIAL HABITS: Nonsmoker. No history of alcohol or other drug abuse.   ADMISSION MEDICATIONS: 1. Amlodipine 5 mg a day. 2. Aspirin 325 mg a day. 3. Metformin 500 mg once a day. 4. Metoprolol 25 mg taking 1-1/2 tablets twice a day.  5. Multivitamin once a day.  6. Omega-3 taking 2 tablets twice a day.  7. Pravastatin 10 mg a day. 8. Quinapril 40 mg once a day.  9. Senokot 1 tablet once a day.  10. Warfarin 4 mg daily, except two days per week, on Wednesday and Saturday. She takes 5 mg on these days.   ALLERGIES: Vecuronium causing skin rash and hives.   PHYSICAL EXAMINATION:   VITAL SIGNS: Blood pressure 152/76, respiratory rate 16, and pulse 86. This has changed when she converted to atrial fibrillation spontaneously with a rate of 130 to 150 per minute, irregular. Temperature is 98.4. Her oxygen saturation is 97%.   GENERAL APPEARANCE: Elderly pleasant lady lying in bed in no acute distress.   HEAD AND NECK EXAMINATION: No pallor. No icterus. No cyanosis.  EARS, NOSE, AND THROAT:  Hearing was normal. Nasal mucosa, lips, and  tongue were normal.   EYES: Normal eyelids and conjunctivae. Pupils are about 4 mm, equal and reactive to light.   NECK: Supple. Trachea at midline. No thyromegaly. No cervical lymphadenopathy. No masses.   HEART: Irregular S1 and S2. No S3 or S4. No murmur. No gallop. No  carotid bruits.   RESPIRATORY: Normal breathing pattern without use of accessory muscles. No rales. No wheezing.   ABDOMEN: Soft without tenderness. No hepatosplenomegaly. No masses. No hernias.   SKIN: No ulcers. No subcutaneous nodules.   MUSCULOSKELETAL: No joint swelling. No clubbing.   NEUROLOGIC: Cranial nerves II through XII are intact. No focal motor deficit.   PSYCHIATRY: The patient is alert and oriented x3. Mood and affect were normal.   LABORATORY AND DIAGNOSTIC DATA: Her initial EKG showed normal sinus rhythm at rate of 89 per minute with premature complexes. Old anterior myocardial infarction. Subsequent EKG revealed atrial fibrillation with rapid ventricular rate at 148 minutes, old anterior myocardial infarction, and ST depression in the lateral leads.   Serum glucose 107, BUN 12, creatinine 0.5, sodium 141, and potassium 3. Liver function tests were normal, except for slightly low albumin at 3.3 and bilirubin 1.2. Troponin less than 0.02. CBC showed white count of 8000, hemoglobin 12, hematocrit 35, and platelet count 214. Prothrombin time 18, INR 1.5, and APTT was 40.   ASSESSMENT:  1. Chest pain or recurrent chest pain since the afternoon today. Her first troponin is normal.  2. Paroxysmal atrial fibrillation. The patient came here to the emergency department with normal sinus rhythm. While she has been here she spontaneously converted to atrial fibrillation with rapid ventricular rate. The patient does have history of atrial fibrillation in the past.  3. The patient has history of coronary artery disease status post coronary artery bypass graft in November 2012.  4. Hypertension.  5. Diabetes mellitus, type II.  6. Hyperlipidemia.  7. Hypokalemia.  8. History of deep vein thrombosis and chronic anticoagulation with Coumadin.   PLAN: We will admit the patient to telemetry monitoring. I ordered metoprolol 5 mg IV x1 dose. Follow up on cardiac enzymes. Repeat EKG in the  morning. Cardiology consultation with Dr. Nehemiah Massed, who is her cardiologist. I did not schedule the patient for nuclear study. I will leave that for Dr. Nehemiah Massed to decide. On the other hand, the primary objective at this point is to control the atrial fibrillation and bring the heart rate to a normal level. It is quite possible that she has tachycardia associated ischemia and it is of utmost importance to bring the heart rate down to normal rate. In the next phase, she may need stress test. We need to optimize her anticoagulation. I ordered Lovenox as a bridge until we correct her prothrombin time. I ordered one dose of Coumadin, 7 mg today, and then tomorrow starting 5 mg daily. Once her prothrombin time is therapeutic, we can discontinue the Lovenox. I would like to mention that her chest x-ray report is showing some basilar atelectasis versus infiltrates; however, the patient does not have fever, no white count, and she is not complaining of cough. I will continue the rest of her home medications, as listed above. However, I will increase the metoprolol from the current dose to 50 mg twice a day. Continue aspirin. I spoke with the patient regarding a Living Will. She does not have one and did not pursue it yet. However, she appointed her son, his name is Yvone Neu, as having the power of attorney to  help make medical decisions.   TIME SPENT: Time spent evaluating this patient, discussing the findings with her and her son and reviewing medical records took more than one hour.  ____________________________ Clovis Pu. Lenore Manner, MD amd:slb D: 03/02/2012 23:48:20 ET    T: 03/03/2012 08:20:58 ET       JOB#: [id number] cc: Clovis Pu. Lenore Manner, MD, <Dictator> Kerin Perna, MD Mike Craze Irven Coe MD ELECTRONICALLY SIGNED 03/04/2012 8:10

## 2015-01-09 NOTE — Discharge Summary (Signed)
PATIENT NAME:  [name], [name] MR#:  [id number] DATE OF BIRTH:  1941-07-03  DATE OF ADMISSION:  03/02/2012 DATE OF DISCHARGE:  03/03/2012  PRESENTING COMPLAINT: Chest pressure and palpitations.   DISCHARGE DIAGNOSES:  1. Rapid atrial fibrillation, resolved, now in sinus rhythm.  2. History of paroxysmal atrial fibrillation.  3. History of hypertension.  4. Type 2 diabetes.  5. History of pulmonary embolus, on Coumadin.   DISCHARGE MEDICATIONS:  1. Amlodipine 5 mg daily. 2. Aspirin 325 mg p.o. daily.  3. Multivitamin p.o. daily.  4. Omega-3 fatty acid, 2 tablets b.i.d.  5. Quinapril 40 mg daily.  6. Metformin 500 mg daily.  7. Pravastatin 10 mg at bedtime.  8. Senokot 1 tablet once a day at bedtime.  9. Metoprolol 50 mg b.i.d.  10. Warfarin 6 mg at 5:00 p.m.   DISCHARGE INSTRUCTIONS AND FOLLOWUP:   1. The patient is to get PT-INR checked with Dr. Hortencia Pilar on June 20th.   2. She will follow up with Dr. Hoy Morn on June 27th at 2:40 p.m.  3. Follow up with Dr. Nehemiah Massed July 8th at 10:30 a.m.  4. Troponin is less than 0.2.  5. EKG showed atrial fibrillation with heart rate in the 140s.  6. CBC within normal limits.  7. Comprehensive metabolic panel within normal limits except potassium of 3.0.  8. PT-INR is 1.5.   BRIEF SUMMARY OF HOSPITAL COURSE: Ms. Pacifico is a 74 year old very pleasant Caucasian female with past medical history of hypertension and type 2 diabetes, comes in with:  1. Chest pressure in the setting of rapid atrial fibrillation: Chest pressure resolved.  Cardiac enzymes remained negative.  She was seen by Dr. Nehemiah Massed, who recommended to increase her beta blocker  and follow up as an outpatient.  2. Rapid atrial fibrillation, resolved now, with history of paroxysmal atrial fibrillation: Now the patient is in sinus rhythm. Her  Toprol was increased to 50 mg b.i.d.  She was continued on Coumadin, dosage was adjusted. She will follow up with the INR as  outpatient on June 20th at [facility].  3. Coronary artery disease: Status post CABG.  4. Hypertension: Her home medications were continued.  5. Diabetes: She was continued on metformin.  6. History of pulmonary embolus: Coumadin was continued. Dosage was adjusted since her INR was subtherapeutic.   The hospital stay otherwise remained stable.   CODE STATUS: The patient remained a FULL CODE.   TIME SPENT:   40 minutes.    ____________________________ Hart Rochester Posey Pronto, MD sap:cbb D: 03/03/2012 15:18:52 ET T: 03/03/2012 16:27:40 ET JOB#: [id number]  cc: Yamir Carignan A. Posey Pronto, MD, <Dictator> Kerin Perna, MD Corey Skains, MD Ilda Basset MD ELECTRONICALLY SIGNED 03/08/2012 15:45

## 2015-06-02 ENCOUNTER — Other Ambulatory Visit: Payer: Self-pay

## 2015-06-02 ENCOUNTER — Observation Stay
Admit: 2015-06-02 | Discharge: 2015-06-02 | Disposition: A | Discharge status: Home / Self Care | Payer: Medicare Other | Attending: Specialist | Admitting: Specialist

## 2015-06-02 ENCOUNTER — Observation Stay
Admission: EM | Admit: 2015-06-02 | Discharge: 2015-06-03 | Disposition: A | Discharge status: Home / Self Care | Payer: Medicare Other | Attending: Internal Medicine | Admitting: Internal Medicine

## 2015-06-02 ENCOUNTER — Observation Stay: Payer: Medicare Other

## 2015-06-02 ENCOUNTER — Encounter: Payer: Self-pay | Admitting: Emergency Medicine

## 2015-06-02 ENCOUNTER — Emergency Department: Payer: Medicare Other

## 2015-06-02 DIAGNOSIS — E785 Hyperlipidemia, unspecified: Secondary | ICD-10-CM | POA: Insufficient documentation

## 2015-06-02 DIAGNOSIS — Z9889 Other specified postprocedural states: Secondary | ICD-10-CM | POA: Insufficient documentation

## 2015-06-02 DIAGNOSIS — Z825 Family history of asthma and other chronic lower respiratory diseases: Secondary | ICD-10-CM | POA: Diagnosis not present

## 2015-06-02 DIAGNOSIS — Z9049 Acquired absence of other specified parts of digestive tract: Secondary | ICD-10-CM | POA: Diagnosis not present

## 2015-06-02 DIAGNOSIS — R42 Dizziness and giddiness: Secondary | ICD-10-CM | POA: Insufficient documentation

## 2015-06-02 DIAGNOSIS — Z7901 Long term (current) use of anticoagulants: Secondary | ICD-10-CM | POA: Insufficient documentation

## 2015-06-02 DIAGNOSIS — I482 Chronic atrial fibrillation: Secondary | ICD-10-CM | POA: Insufficient documentation

## 2015-06-02 DIAGNOSIS — Z86711 Personal history of pulmonary embolism: Secondary | ICD-10-CM | POA: Insufficient documentation

## 2015-06-02 DIAGNOSIS — Z823 Family history of stroke: Secondary | ICD-10-CM | POA: Diagnosis not present

## 2015-06-02 DIAGNOSIS — I639 Cerebral infarction, unspecified: Secondary | ICD-10-CM | POA: Diagnosis not present

## 2015-06-02 DIAGNOSIS — I739 Peripheral vascular disease, unspecified: Secondary | ICD-10-CM | POA: Diagnosis not present

## 2015-06-02 DIAGNOSIS — Z90710 Acquired absence of both cervix and uterus: Secondary | ICD-10-CM | POA: Diagnosis not present

## 2015-06-02 DIAGNOSIS — R531 Weakness: Secondary | ICD-10-CM | POA: Insufficient documentation

## 2015-06-02 DIAGNOSIS — Z79899 Other long term (current) drug therapy: Secondary | ICD-10-CM | POA: Insufficient documentation

## 2015-06-02 DIAGNOSIS — I2510 Atherosclerotic heart disease of native coronary artery without angina pectoris: Secondary | ICD-10-CM | POA: Diagnosis not present

## 2015-06-02 DIAGNOSIS — I6529 Occlusion and stenosis of unspecified carotid artery: Secondary | ICD-10-CM | POA: Diagnosis not present

## 2015-06-02 DIAGNOSIS — G459 Transient cerebral ischemic attack, unspecified: Secondary | ICD-10-CM | POA: Diagnosis present

## 2015-06-02 DIAGNOSIS — T502X5A Adverse effect of carbonic-anhydrase inhibitors, benzothiadiazides and other diuretics, initial encounter: Secondary | ICD-10-CM | POA: Diagnosis not present

## 2015-06-02 DIAGNOSIS — E876 Hypokalemia: Secondary | ICD-10-CM | POA: Insufficient documentation

## 2015-06-02 DIAGNOSIS — Z888 Allergy status to other drugs, medicaments and biological substances status: Secondary | ICD-10-CM | POA: Insufficient documentation

## 2015-06-02 DIAGNOSIS — E119 Type 2 diabetes mellitus without complications: Secondary | ICD-10-CM | POA: Insufficient documentation

## 2015-06-02 DIAGNOSIS — Z7982 Long term (current) use of aspirin: Secondary | ICD-10-CM | POA: Insufficient documentation

## 2015-06-02 DIAGNOSIS — I10 Essential (primary) hypertension: Secondary | ICD-10-CM | POA: Diagnosis not present

## 2015-06-02 DIAGNOSIS — Z8249 Family history of ischemic heart disease and other diseases of the circulatory system: Secondary | ICD-10-CM | POA: Diagnosis not present

## 2015-06-02 HISTORY — DX: Other pulmonary embolism without acute cor pulmonale: I26.99

## 2015-06-02 HISTORY — DX: Bradycardia, unspecified: R00.1

## 2015-06-02 HISTORY — DX: Diverticulitis of intestine, part unspecified, without perforation or abscess without bleeding: K57.92

## 2015-06-02 HISTORY — DX: Type 2 diabetes mellitus without complications: E11.9

## 2015-06-02 HISTORY — DX: Unspecified atrial fibrillation: I48.91

## 2015-06-02 HISTORY — DX: Atherosclerotic heart disease of native coronary artery without angina pectoris: I25.10

## 2015-06-02 LAB — CBC
HEMATOCRIT: 36.3 % (ref 35.0–47.0)
Hemoglobin: 12.6 g/dL (ref 12.0–16.0)
MCH: 31.9 pg (ref 26.0–34.0)
MCHC: 34.8 g/dL (ref 32.0–36.0)
MCV: 91.9 fL (ref 80.0–100.0)
PLATELETS: 186 10*3/uL (ref 150–440)
RBC: 3.95 MIL/uL (ref 3.80–5.20)
RDW: 13.6 % (ref 11.5–14.5)
WBC: 7 10*3/uL (ref 3.6–11.0)

## 2015-06-02 LAB — COMPREHENSIVE METABOLIC PANEL
ALT: 21 U/L (ref 14–54)
ANION GAP: 9 (ref 5–15)
AST: 31 U/L (ref 15–41)
Albumin: 3.5 g/dL (ref 3.5–5.0)
Alkaline Phosphatase: 54 U/L (ref 38–126)
BILIRUBIN TOTAL: 1.7 mg/dL — AB (ref 0.3–1.2)
BUN: 13 mg/dL (ref 6–20)
CHLORIDE: 101 mmol/L (ref 101–111)
CO2: 28 mmol/L (ref 22–32)
Calcium: 8.7 mg/dL — ABNORMAL LOW (ref 8.9–10.3)
Creatinine, Ser: 0.73 mg/dL (ref 0.44–1.00)
Glucose, Bld: 170 mg/dL — ABNORMAL HIGH (ref 65–99)
POTASSIUM: 2.6 mmol/L — AB (ref 3.5–5.1)
Sodium: 138 mmol/L (ref 135–145)
TOTAL PROTEIN: 6.7 g/dL (ref 6.5–8.1)

## 2015-06-02 LAB — APTT: aPTT: 31 seconds (ref 24–36)

## 2015-06-02 LAB — MAGNESIUM: Magnesium: 2 mg/dL (ref 1.7–2.4)

## 2015-06-02 LAB — PROTIME-INR
INR: 1.79
PROTHROMBIN TIME: 21 s — AB (ref 11.4–15.0)

## 2015-06-02 MED ORDER — ONDANSETRON HCL 4 MG/2ML IJ SOLN: 4.0000 mg | Freq: Four times a day (QID) | INTRAMUSCULAR | Status: DC | PRN

## 2015-06-02 MED ORDER — POTASSIUM CHLORIDE CRYS ER 20 MEQ PO TBCR
20.0000 meq | EXTENDED_RELEASE_TABLET | Freq: Two times a day (BID) | ORAL | Status: DC
  Administered 2015-06-02 (×2): 20 meq via ORAL
  Filled 2015-06-02 (×2): qty 1

## 2015-06-02 MED ORDER — METOPROLOL TARTRATE 25 MG PO TABS
25.0000 mg | ORAL_TABLET | Freq: Two times a day (BID) | ORAL | Status: DC
  Administered 2015-06-02 – 2015-06-03 (×2): 25 mg via ORAL
  Filled 2015-06-02 (×2): qty 1

## 2015-06-02 MED ORDER — WARFARIN SODIUM 2 MG PO TABS: 2.0000 mg | ORAL_TABLET | ORAL | Status: DC

## 2015-06-02 MED ORDER — HYDROCHLOROTHIAZIDE 25 MG PO TABS
25.0000 mg | ORAL_TABLET | Freq: Every day | ORAL | Status: DC
Start: 2015-06-02 — End: 2015-06-03

## 2015-06-02 MED ORDER — WARFARIN SODIUM 2 MG PO TABS
4.0000 mg | ORAL_TABLET | ORAL | Status: DC
  Administered 2015-06-02: 22:00:00 4 mg via ORAL
  Filled 2015-06-02 (×2): qty 2

## 2015-06-02 MED ORDER — WARFARIN - PHYSICIAN DOSING INPATIENT
Freq: Every day | Status: DC
  Administered 2015-06-02: 22:00:00

## 2015-06-02 MED ORDER — ACETAMINOPHEN 325 MG PO TABS: 650.0000 mg | ORAL_TABLET | Freq: Four times a day (QID) | ORAL | Status: DC | PRN

## 2015-06-02 MED ORDER — OMEGA-3-ACID ETHYL ESTERS 1 G PO CAPS
1.0000 g | ORAL_CAPSULE | Freq: Every day | ORAL | Status: DC
  Administered 2015-06-03: 1 g via ORAL
  Filled 2015-06-02: qty 1

## 2015-06-02 MED ORDER — ASPIRIN EC 81 MG PO TBEC
81.0000 mg | DELAYED_RELEASE_TABLET | Freq: Every day | ORAL | Status: DC
  Administered 2015-06-02: 22:00:00 81 mg via ORAL
  Filled 2015-06-02: qty 1

## 2015-06-02 MED ORDER — SODIUM CHLORIDE 0.9 % IV SOLN
1000.0000 mL | Freq: Once | INTRAVENOUS | Status: AC
  Administered 2015-06-02: 1000 mL via INTRAVENOUS

## 2015-06-02 MED ORDER — ACETAMINOPHEN 650 MG RE SUPP: 650.0000 mg | Freq: Four times a day (QID) | RECTAL | Status: DC | PRN

## 2015-06-02 MED ORDER — QUINAPRIL HCL 10 MG PO TABS
40.0000 mg | ORAL_TABLET | Freq: Every day | ORAL | Status: DC
  Administered 2015-06-03: 40 mg via ORAL
  Filled 2015-06-02: qty 4

## 2015-06-02 MED ORDER — PRAVASTATIN SODIUM 20 MG PO TABS
40.0000 mg | ORAL_TABLET | Freq: Every day | ORAL | Status: DC
  Administered 2015-06-02: 22:00:00 40 mg via ORAL
  Filled 2015-06-02: qty 2

## 2015-06-02 MED ORDER — ONDANSETRON HCL 4 MG PO TABS: 4.0000 mg | ORAL_TABLET | Freq: Four times a day (QID) | ORAL | Status: DC | PRN

## 2015-06-02 MED ORDER — ADULT MULTIVITAMIN W/MINERALS CH
ORAL_TABLET | Freq: Every day | ORAL | Status: DC
  Administered 2015-06-03: 10:00:00 1 via ORAL
  Filled 2015-06-02: qty 1

## 2015-06-02 MED ORDER — AMLODIPINE BESYLATE 5 MG PO TABS
2.5000 mg | ORAL_TABLET | Freq: Every day | ORAL | Status: DC
  Administered 2015-06-03: 10:00:00 2.5 mg via ORAL
  Filled 2015-06-02: qty 1

## 2015-06-02 NOTE — Progress Notes (Signed)
*  PRELIMINARY RESULTS* Echocardiogram 2D Echocardiogram has been performed.  [name] 06/02/2015, 8:44 PM

## 2015-06-02 NOTE — H&P (Signed)
[facility] NAME: [name]    MR#:  [id number]  DATE OF BIRTH:  11-29-1940  DATE OF ADMISSION:  06/02/2015  PRIMARY CARE PHYSICIAN: Hortencia Pilar, MD   REQUESTING/REFERRING PHYSICIAN: Dr. Lavonia Drafts  CHIEF COMPLAINT:   Chief Complaint  Patient presents with  . Weakness   right foot weakness.  HISTORY OF PRESENT ILLNESS:  [name]  is a 74 y.o. female with a known history of diabetes type 2 without compensation, chronic A. fib, history of PE, history of chronic disease, diverticulitis, who presented to the hospital due to dizziness and right foot weakness. Patient says she woke up this morning and felt a little dizzy and lightheaded and thought that she was going to pass out but she did not. She also had some right foot weakness and therefore was not concerning came to the ER for further evaluation. Patient also admits to nausea but no acute vomiting. She denies abdominal pain, chest pain, shortness of breath or any other associated symptoms presently. Patient does have a history of PE and is on Coumadin but her INR is subtherapeutic. Due to acute neurologic symptoms and subtherapeutic INR and history of A. fib it is suspected she could possibly have had a stroke and therefore hospitalist services were contacted further treatment and evaluation.  PAST MEDICAL HISTORY:   Past Medical History  Diagnosis Date  . Diabetes mellitus without complication   . A-fib   . Coronary artery disease   . Pulmonary embolism   . Bradycardia   . Diverticulitis     PAST SURGICAL HISTORY:   Past Surgical History  Procedure Laterality Date  . Cardiac surgery    . Abdominal hysterectomy    . Cholecystectomy    . Eye surgery      SOCIAL HISTORY:   Social History  Substance Use Topics  . Smoking status: Never Smoker   . Smokeless tobacco: Not on file  . Alcohol Use: No    FAMILY HISTORY:   Family History   Problem Relation Age of Onset  . Congestive Heart Failure Mother   . CVA Mother   . COPD Father     DRUG ALLERGIES:   Allergies  Allergen Reactions  . Vecuronium Rash    REVIEW OF SYSTEMS:   Review of Systems  Constitutional: Negative for fever and weight loss.  HENT: Negative for congestion, nosebleeds and tinnitus.   Eyes: Negative for blurred vision, double vision and redness.  Respiratory: Negative for cough, hemoptysis and shortness of breath.   Cardiovascular: Negative for chest pain, orthopnea, leg swelling and PND.  Gastrointestinal: Negative for nausea, vomiting, abdominal pain, diarrhea and melena.  Genitourinary: Negative for dysuria, urgency and hematuria.  Musculoskeletal: Negative for joint pain and falls.  Neurological: Positive for dizziness, speech change (right foot numbness) and focal weakness (right foot weakness). Negative for tingling, sensory change, seizures, weakness and headaches.  Endo/Heme/Allergies: Negative for polydipsia. Does not bruise/bleed easily.  Psychiatric/Behavioral: Negative for depression and memory loss. The patient is not nervous/anxious.     MEDICATIONS AT HOME:   Prior to Admission medications   Medication Sig Start Date End Date Taking? Authorizing Provider  amLODipine (NORVASC) 2.5 MG tablet Take 2.5 mg by mouth daily.   Yes Historical Provider, MD  amoxicillin-clavulanate (AUGMENTIN) 875-125 MG per tablet Take 1 tablet by mouth every 12 (twelve) hours. For 10 days 05/31/15 06/10/15 Yes Historical Provider, MD  aspirin EC 81 MG tablet Take  81 mg by mouth at bedtime.   Yes Historical Provider, MD  hydrochlorothiazide (HYDRODIURIL) 25 MG tablet Take 25 mg by mouth daily.   Yes Historical Provider, MD  metoprolol tartrate (LOPRESSOR) 25 MG tablet Take 25 mg by mouth 2 (two) times daily.   Yes Historical Provider, MD  Multiple Vitamins-Minerals (CENTRUM SILVER PO) Take 1 tablet by mouth daily.   Yes Historical Provider, MD  omega-3  acid ethyl esters (LOVAZA) 1 G capsule Take 1 g by mouth daily.   Yes Historical Provider, MD  pravastatin (PRAVACHOL) 40 MG tablet Take 40 mg by mouth at bedtime.   Yes Historical Provider, MD  quinapril (ACCUPRIL) 40 MG tablet Take 40 mg by mouth daily.   Yes Historical Provider, MD  warfarin (COUMADIN) 4 MG tablet Take 2-4 mg by mouth every evening. 4 mg every evening except on Monday, 2 mg every Monday evening   Yes Historical Provider, MD      VITAL SIGNS:  Blood pressure 140/76, pulse 63, temperature 98.4 F (36.9 C), resp. rate 16, height 5\' 1"  (1.549 m), weight 72.576 kg (160 lb), SpO2 96 %.  PHYSICAL EXAMINATION:  Physical Exam  GENERAL:  74 y.o.-year-old patient lying in the bed with no acute distress.  EYES: Pupils equal, round, reactive to light and accommodation. No scleral icterus. Extraocular muscles intact.  HEENT: Head atraumatic, normocephalic. Oropharynx and nasopharynx clear. No oropharyngeal erythema, moist oral mucosa  NECK:  Supple, no jugular venous distention. No thyroid enlargement, no tenderness.  LUNGS: Normal breath sounds bilaterally, no wheezing, rales, rhonchi. No use of accessory muscles of respiration.  CARDIOVASCULAR: S1, S2 RRR. No murmurs, rubs, gallops, clicks.  ABDOMEN: Soft, nontender, nondistended. Bowel sounds present. No organomegaly or mass.  EXTREMITIES: No pedal edema, cyanosis, or clubbing. + 2 pedal & radial pulses b/l.   NEUROLOGIC: Cranial nerves II through XII are intact. No focal Motor or sensory deficits appreciated b/l PSYCHIATRIC: The patient is alert and oriented x 3. Good affect.  SKIN: No obvious rash, lesion, or ulcer.   LABORATORY PANEL:   CBC  Recent Labs Lab 06/02/15 1004  WBC 7.0  HGB 12.6  HCT 36.3  PLT 186   ------------------------------------------------------------------------------------------------------------------  Chemistries   Recent Labs Lab 06/02/15 1004  NA 138  K 2.6*  CL 101  CO2 28   GLUCOSE 170*  BUN 13  CREATININE 0.73  CALCIUM 8.7*  AST 31  ALT 21  ALKPHOS 54  BILITOT 1.7*   ------------------------------------------------------------------------------------------------------------------  Cardiac Enzymes No results for input(s): TROPONINI in the last 168 hours. ------------------------------------------------------------------------------------------------------------------  RADIOLOGY:  Ct Head Wo Contrast  06/02/2015   CLINICAL DATA:  Around 0830 this morning she was walking in the hall and she noticed weakness to right foot and then she started to feel lightheaded and weak.  EXAM: CT HEAD WITHOUT CONTRAST  TECHNIQUE: Contiguous axial images were obtained from the base of the skull through the vertex without intravenous contrast.  COMPARISON:  None.  FINDINGS: The brainstem, cerebellum, cerebral peduncles, thalamus, basal ganglia, basilar cisterns, and ventricular system appear within normal limits. Periventricular white matter and corona radiata hypodensities favor chronic ischemic microvascular white matter disease. No intracranial hemorrhage, mass lesion, or acute CVA.  There is atherosclerotic calcification of the cavernous carotid arteries bilaterally.  IMPRESSION: 1. No acute intracranial findings. 2. Mild chronic ischemic microvascular white matter disease.   Electronically Signed   By: Van Clines M.D.   On: 06/02/2015 11:09     IMPRESSION AND PLAN:  74 year old female with past medical history of chronic afibrillation, hypertension, diabetes, hyperlipidemia, history of pulmonary embolism who presented to the hospital due to right foot weakness and dizziness.  #1 right foot weakness/dizziness-etiology unclear but suspicion for possible CVA.  Patient does have a history of chronic afibrillation is subtherapeutic on INR. -I will observe her overnight, follow every 2 hours neuro checks. Her initial CT head is negative. -I will get MRI brain, carotid  duplex, and two-dimensional echo -Continue warfarin.  #2 history of chronic afibrillation-rate controlled and continue metoprolol. -Continue warfarin.  #3 hypokalemia-I will order some potassium supplements. Check magnesium level.  #4 hypertension-continue HCTZ, metoprolol, quinapril, Norvasc.  #5 hyperlipidemia-continue Pravachol.    All the records are reviewed and case discussed with ED provider. Management plans discussed with the patient, family and they are in agreement.  CODE STATUS: Full  TOTAL TIME TAKING CARE OF THIS PATIENT: 45 minutes.    Henreitta Leber M.D on 06/02/2015 at 12:49 PM  Between 7am to 6pm - Pager - [phone]  After 6pm go to [email or web address] - password [email or web address] [facility]  Office  [phone]  CC: Primary care physician; Hortencia Pilar, MD

## 2015-06-02 NOTE — ED Provider Notes (Signed)
[facility] Emergency Department Provider Note  ____________________________________________  Time seen: On arrival  I have reviewed the triage vital signs and the nursing notes.   HISTORY  Chief Complaint Weakness    HPI Stephanie GEVING is a 74 y.o. female who presents with complaints of dizziness that started this morning. Per the patient she was feeling well when she woke up this morning, she fixes breakfast and after she ate she started to feel lightheaded. She stood up to try to walk it off but it seemed to get worse. She did not pass out. She denies chest pain. She denies headache. She does report for a brief. She had a sensation that her right foot was heavy. That has resolved prior to arrival. Patient is on Coumadin for atrial fibrillation. She has a history of a coronary artery bypass. She also has a history of diabetes. No history of CVA. She is currently on Augmentin for diverticulitis and has been having significant diarrhea reportedly.     Past Medical History  Diagnosis Date  . Diabetes mellitus without complication   . A-fib   . Coronary artery disease   . Pulmonary embolism   . Bradycardia   . Diverticulitis     There are no active problems to display for this patient.   Past Surgical History  Procedure Laterality Date  . Cardiac surgery    . Abdominal hysterectomy    . Cholecystectomy    . Eye surgery      Current Outpatient Rx  Name  Route  Sig  Dispense  Refill  . amLODipine (NORVASC) 2.5 MG tablet   Oral   Take 2.5 mg by mouth daily.         Marland Kitchen amoxicillin-clavulanate (AUGMENTIN) 875-125 MG per tablet   Oral   Take 1 tablet by mouth every 12 (twelve) hours. For 10 days         . aspirin EC 81 MG tablet   Oral   Take 81 mg by mouth at bedtime.         . hydrochlorothiazide (HYDRODIURIL) 25 MG tablet   Oral   Take 25 mg by mouth daily.         . metoprolol tartrate (LOPRESSOR) 25 MG tablet   Oral   Take  25 mg by mouth 2 (two) times daily.         . Multiple Vitamins-Minerals (CENTRUM SILVER PO)   Oral   Take 1 tablet by mouth daily.         Marland Kitchen omega-3 acid ethyl esters (LOVAZA) 1 G capsule   Oral   Take 1 g by mouth daily.         . pravastatin (PRAVACHOL) 40 MG tablet   Oral   Take 40 mg by mouth at bedtime.         . quinapril (ACCUPRIL) 40 MG tablet   Oral   Take 40 mg by mouth daily.         Marland Kitchen warfarin (COUMADIN) 4 MG tablet   Oral   Take 2-4 mg by mouth every evening. 4 mg every evening except on Monday, 2 mg every Monday evening           Allergies Vecuronium  No family history on file.  Social History Social History  Substance Use Topics  . Smoking status: Never Smoker   . Smokeless tobacco: None  . Alcohol Use: No    Review of Systems  Constitutional: Negative for fever.  Eyes: Negative for visual changes. ENT: Negative for sore throat Cardiovascular: Negative for chest pain. Respiratory: Negative for shortness of breath. Gastrointestinal: positive for diarrhea Genitourinary: Negative for dysuria. Musculoskeletal: Negative for back pain. Skin: Negative for rash. Neurological: Negative for headaches Psychiatric:mild anxiety    ____________________________________________   PHYSICAL EXAM:  VITAL SIGNS: ED Triage Vitals  Enc Vitals Group     BP 06/02/15 0951 156/71 mmHg     Pulse Rate 06/02/15 0951 56     Resp 06/02/15 0951 18     Temp 06/02/15 1012 98.4 F (36.9 C)     Temp src --      SpO2 06/02/15 0951 98 %     Weight --      Height --      Head Cir --      Peak Flow --      Pain Score --      Pain Loc --      Pain Edu? --      Excl. in [employer]? --      Constitutional: Alert and oriented. Well appearing and in no distress.pleasant and interactive Eyes: Conjunctivae are normal. PERRLA. EOMI ENT   Head: Normocephalic and atraumatic.   Mouth/Throat: Mucous membranes are moist. Cardiovascular: Normal rate, regular  rhythm. Normal and symmetric distal pulses are present in all extremities. No murmurs, rubs, or gallops. Respiratory: Normal respiratory effort without tachypnea nor retractions. Breath sounds are clear and equal bilaterally.  Gastrointestinal: Soft and non-tender in all quadrants. No distention. There is no CVA tenderness. Genitourinary: deferred Musculoskeletal: Nontender with normal range of motion in all extremities. No lower extremity tenderness nor edema. Neurologic:  Normal speech and language. No focal weakness. Sensation grossly intact. Cranial nerves II through XII are intact Skin:  Skin is warm, dry and intact. No rash noted. Psychiatric: Mood and affect are normal. Patient exhibits appropriate insight and judgment.  ____________________________________________    LABS (pertinent positives/negatives)  Labs Reviewed  PROTIME-INR - Abnormal; Notable for the following:    Prothrombin Time 21.0 (*)    All other components within normal limits  COMPREHENSIVE METABOLIC PANEL - Abnormal; Notable for the following:    Potassium 2.6 (*)    Glucose, Bld 170 (*)    Calcium 8.7 (*)    Total Bilirubin 1.7 (*)    All other components within normal limits  APTT  CBC  CBG MONITORING, ED    ____________________________________________   EKG  ED ECG REPORT I, Lavonia Drafts, the attending physician, personally viewed and interpreted this ECG.   Date: 06/02/2015  EKG Time: 9:52 AM  Rate: 56  Rhythm: sinus bradycardia  Axis: normal axis  Intervals:none  ST&T Change: nonspecific   ____________________________________________    RADIOLOGY I have personally reviewed any xrays that were ordered on this patient: CT head unremarkable  ____________________________________________   PROCEDURES  Procedure(s) performed: none  Critical Care performed: none  ____________________________________________   INITIAL IMPRESSION / ASSESSMENT AND PLAN / ED COURSE  Pertinent  labs & imaging results that were available during my care of the patient were reviewed by me and considered in my medical decision making (see chart for details).  Patient's initial complaint of dizziness has resolved on the way to the hospital in ambulance. She has no weakness in her leg. Differential diagnosis includes near-syncope, CAD/ACS, TIA, dehydration.we will obtain lab work, CT head, EKG give normal saline and reevaluate. NIH stroke scale 0.  ----------------------------------------- 12:22 PM on 06/02/2015 -----------------------------------------  Patient potassium was  somewhat low platelet related to diarrhetic and diarrhea. Her CT head is normal I'm concerned about a TIA given her subtherapeutic Coumadin, multiple comorbidities and description of heaviness in the right leg/foot. Feel she should likely be admitted and will discuss with hospitalist  ____________________________________________   FINAL CLINICAL IMPRESSION(S) / ED DIAGNOSES  Final diagnoses:  Transient cerebral ischemia, unspecified transient cerebral ischemia type     Lavonia Drafts, MD 06/02/15 1223

## 2015-06-02 NOTE — Plan of Care (Signed)
Problem: Discharge/Transitional Outcomes Goal: Other Discharge Outcomes/Goals Outcome: Progressing Plan of Progress to Goal: Discharge Plan in Place and Appropriate: 1. Barriers:         No barriers noted. 2. Educational Plan:         Stroke Handout Booklet Provided and Explained. 3. Hemodynamically Stable:         Monitoring Vital Signs Q2hrs. WDL's.         Monitoring Neuro Checks Q2hrs. WDL's.         Labs WDL's. 4. Independent Mobility/functioning independent or with min:         OOB to Bathroom with Standby Assist. Tolerated well. 5. Diet:         Passed RN Bedside Swallow Screen.          Healthy Heart Diet. Tolerating Well.         Tolerating Thin Liquids. 6. INR Monitor Plan Established:         Baseline PT INR completed.         Coumadin being Initiated per Pharmacy Dose.

## 2015-06-02 NOTE — ED Notes (Signed)
Lab called with critical potassium of 2.6, Dr. Corky Downs aware and acknowledged result

## 2015-06-02 NOTE — ED Notes (Signed)
Pt to ED via EMS transport from home, pt states around 0830 she was walking in the hall and she noticed weakness to right foot and then she started to feel lightheaded and weak, states symptoms have resolved at this time, states she started feeling better while riding in ambulance

## 2015-06-03 LAB — BASIC METABOLIC PANEL
Anion gap: 8 (ref 5–15)
BUN: 15 mg/dL (ref 6–20)
CHLORIDE: 105 mmol/L (ref 101–111)
CO2: 27 mmol/L (ref 22–32)
Calcium: 8.4 mg/dL — ABNORMAL LOW (ref 8.9–10.3)
Creatinine, Ser: 0.69 mg/dL (ref 0.44–1.00)
GFR calc Af Amer: >60 mL/min (ref >60)
GFR calc non Af Amer: >60 mL/min (ref >60)
GLUCOSE: 118 mg/dL — AB (ref 65–99)
POTASSIUM: 3.1 mmol/L — AB (ref 3.5–5.1)
SODIUM: 140 mmol/L (ref 135–145)

## 2015-06-03 LAB — CBC
HCT: 36.8 % (ref 35.0–47.0)
Hemoglobin: 12.3 g/dL (ref 12.0–16.0)
MCH: 31.3 pg (ref 26.0–34.0)
MCHC: 33.4 g/dL (ref 32.0–36.0)
MCV: 93.6 fL (ref 80.0–100.0)
Platelets: 203 10*3/uL (ref 150–440)
RBC: 3.94 MIL/uL (ref 3.80–5.20)
RDW: 13.3 % (ref 11.5–14.5)
WBC: 7.2 10*3/uL (ref 3.6–11.0)

## 2015-06-03 MED ORDER — WARFARIN SODIUM 4 MG PO TABS: ORAL_TABLET | ORAL | Status: DC

## 2015-06-03 MED ORDER — POTASSIUM CHLORIDE ER 10 MEQ PO TBCR
EXTENDED_RELEASE_TABLET | ORAL | Status: DC
Start: 2015-06-03 — End: 2017-01-15

## 2015-06-03 MED ORDER — POTASSIUM CHLORIDE CRYS ER 20 MEQ PO TBCR
40.0000 meq | EXTENDED_RELEASE_TABLET | Freq: Once | ORAL | Status: AC
  Administered 2015-06-03: 10:00:00 40 meq via ORAL
  Filled 2015-06-03: qty 2

## 2015-06-03 NOTE — Discharge Summary (Signed)
[facility] [place] NAME: [name]    MR#:  [id number]  DATE OF BIRTH:  August 14, 1941  DATE OF ADMISSION:  06/02/2015 ADMITTING PHYSICIAN: Henreitta Leber, MD  DATE OF DISCHARGE: No discharge date for patient encounter.  PRIMARY CARE PHYSICIAN: Hortencia Pilar, MD    ADMISSION DIAGNOSIS:  Transient cerebral ischemia, unspecified transient cerebral ischemia type [G45.9]  DISCHARGE DIAGNOSIS:  Active Problems:   CVA (cerebral infarction)   SECONDARY DIAGNOSIS:   Past Medical History  Diagnosis Date  . Diabetes mellitus without complication   . A-fib   . Coronary artery disease   . Pulmonary embolism   . Bradycardia   . Diverticulitis     HOSPITAL COURSE:   1. Right leg sensory change and weakness- likely secondary to hypokalemia. MRI of the brain came back negative for stroke. 2. Hypokalemia likely secondary to hydrochlorothiazide. This medication was stopped. Potassium replacement in the hospital and home for 3 days. Stopping the hydrochlorothiazide should solve this problem. 3. History of atrial fibrillation- INR 1.79- increase Coumadin to 4 mg daily check INR next week. Patient is rate controlled 4. Essential hypertension- continue usual medications minus hydrochlorothiazide. 5. Hyperlipidemia unspecified continue pravastatin 6. 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 Follow-up as outpatient.  DISCHARGE CONDITIONS:   Satisfactory  CONSULTS OBTAINED:  None  DRUG ALLERGIES:   Allergies  Allergen Reactions  . Vecuronium Rash    DISCHARGE MEDICATIONS:   Current Discharge Medication List    START taking these medications   Details  potassium chloride (K-DUR) 10 MEQ tablet One tablet daily for three days Qty: 3 tablet, Refills: 0      CONTINUE these medications which have CHANGED   Details  warfarin (COUMADIN) 4 MG tablet 4 mg every evening Qty: 30  tablet, Refills: 0      CONTINUE these medications which have NOT CHANGED   Details  amLODipine (NORVASC) 2.5 MG tablet Take 2.5 mg by mouth daily.    aspirin EC 81 MG tablet Take 81 mg by mouth at bedtime.    metoprolol tartrate (LOPRESSOR) 25 MG tablet Take 25 mg by mouth 2 (two) times daily.    Multiple Vitamins-Minerals (CENTRUM SILVER PO) Take 1 tablet by mouth daily.    omega-3 acid ethyl esters (LOVAZA) 1 G capsule Take 1 g by mouth daily.    pravastatin (PRAVACHOL) 40 MG tablet Take 40 mg by mouth at bedtime.    quinapril (ACCUPRIL) 40 MG tablet Take 40 mg by mouth daily.      STOP taking these medications     amoxicillin-clavulanate (AUGMENTIN) 875-125 MG per tablet      hydrochlorothiazide (HYDRODIURIL) 25 MG tablet          DISCHARGE INSTRUCTIONS:   Follow-up PMD one week  If you experience worsening of your admission symptoms, develop shortness of breath, life threatening emergency, suicidal or homicidal thoughts you must seek medical attention immediately by calling 911 or calling your MD immediately  if symptoms less severe.  You Must read complete instructions/literature along with all the possible adverse reactions/side effects for all the Medicines you take and that have been prescribed to you. Take any new Medicines after you have completely understood and accept all the possible adverse reactions/side effects.   Please note  You were cared for by a hospitalist during your hospital stay. If you have any questions about your discharge medications or the care  you received while you were in the hospital after you are discharged, you can call the unit and asked to speak with the hospitalist on call if the hospitalist that took care of you is not available. Once you are discharged, your primary care physician will handle any further medical issues. Please note that NO REFILLS for any discharge medications will be authorized once you are discharged, as it is  imperative that you return to your primary care physician (or establish a relationship with a primary care physician if you do not have one) for your aftercare needs so that they can reassess your need for medications and monitor your lab values.    Today   CHIEF COMPLAINT:   Chief Complaint  Patient presents with  . Weakness    HISTORY OF PRESENT ILLNESS:  [name]  is a 74 y.o. female with a known history of A. fib, hypertension, hyperlipidemia presented with right foot sensory change and weakness. Patient was found to have a low potassium.   VITAL SIGNS:  Blood pressure 120/58, pulse 58, temperature 98 F (36.7 C), temperature source Oral, resp. rate 18, height 5\' 1"  (1.549 m), weight 72.576 kg (160 lb), SpO2 94 %.    PHYSICAL EXAMINATION:  GENERAL:  74 y.o.-year-old patient lying in the bed with no acute distress.  EYES: Pupils equal, round, reactive to light and accommodation. No scleral icterus. Extraocular muscles intact.  HEENT: Head atraumatic, normocephalic. Oropharynx and nasopharynx clear.  NECK:  Supple, no jugular venous distention. No thyroid enlargement, no tenderness.  LUNGS: Normal breath sounds bilaterally, no wheezing, rales,rhonchi or crepitation. No use of accessory muscles of respiration.  CARDIOVASCULAR: S1, S2 normal. No murmurs, rubs, or gallops.  ABDOMEN: Soft, non-tender, non-distended. Bowel sounds present. No organomegaly or mass.  EXTREMITIES: No pedal edema, cyanosis, or clubbing.  NEUROLOGIC: Cranial nerves II through XII are intact. Muscle strength 5/5 in all extremities. Sensation intact. Gait not checked.  PSYCHIATRIC: The patient is alert and oriented x 3.  SKIN: No obvious rash, lesion, or ulcer.   DATA REVIEW:   CBC  Recent Labs Lab 06/03/15 0422  WBC 7.2  HGB 12.3  HCT 36.8  PLT 203    Chemistries   Recent Labs Lab 06/02/15 1004 06/03/15 0422  NA 138 140  K 2.6* 3.1*  CL 101 105  CO2 28 27  GLUCOSE 170* 118*   BUN 13 15  CREATININE 0.73 0.69  CALCIUM 8.7* 8.4*  MG 2.0  --   AST 31  --   ALT 21  --   ALKPHOS 54  --   BILITOT 1.7*  --      RADIOLOGY:  Ct Head Wo Contrast  06/02/2015   CLINICAL DATA:  Around 0830 this morning she was walking in the hall and she noticed weakness to right foot and then she started to feel lightheaded and weak.  EXAM: CT HEAD WITHOUT CONTRAST  TECHNIQUE: Contiguous axial images were obtained from the base of the skull through the vertex without intravenous contrast.  COMPARISON:  None.  FINDINGS: The brainstem, cerebellum, cerebral peduncles, thalamus, basal ganglia, basilar cisterns, and ventricular system appear within normal limits. Periventricular white matter and corona radiata hypodensities favor chronic ischemic microvascular white matter disease. No intracranial hemorrhage, mass lesion, or acute CVA.  There is atherosclerotic calcification of the cavernous carotid arteries bilaterally.  IMPRESSION: 1. No acute intracranial findings. 2. Mild chronic ischemic microvascular white matter disease.   Electronically Signed   By: Van Clines  M.D.   On: 06/02/2015 11:09   Mr Brain Wo Contrast  06/02/2015   CLINICAL DATA:  Pt came into ED with right side weakness and light headed; no other symptoms stroke or pressures include coronary artery disease, atrial fibrillation, and diabetes mellitus.  EXAM: MRI HEAD WITHOUT CONTRAST  TECHNIQUE: Multiplanar, multiecho pulse sequences of the brain and surrounding structures were obtained without intravenous contrast.  COMPARISON:  None.  FINDINGS: No acute stroke, hydrocephalus, or extra-axial fluid.  Susceptibility related to a small area of presumed calcification on CT and MR, LEFT occipital lobe, without surrounding edema or restricted diffusion, consistent with a small incidental cavernoma. Heterogeneity of T1 and T2 signal within a lesion that measures only 6 mm diameter favors this as representing an incidental finding.   Generalized atrophy. Chronic microvascular ischemic change. Flow voids are maintained. No midline abnormality. Extracranial soft tissues unremarkable.  IMPRESSION: No cause is seen for right-sided weakness.  Presumed LEFT occipital cortical cavernoma.  Atrophy and small vessel disease, not unexpected for age.   Electronically Signed   By: Staci Righter M.D.   On: 06/02/2015 18:29   US Carotid Bilateral  06/02/2015   CLINICAL DATA:  Stroke, hypertension, coronary disease  EXAM: BILATERAL CAROTID DUPLEX ULTRASOUND  TECHNIQUE: Pearline Cables scale imaging, color Doppler and duplex ultrasound was performed of bilateral carotid and vertebral arteries in the neck.  COMPARISON:  None.  REVIEW OF SYSTEMS: Quantification of carotid stenosis is based on velocity parameters that correlate the residual internal carotid diameter with NASCET-based stenosis levels, using the diameter of the distal internal carotid lumen as the denominator for stenosis measurement.  The following velocity measurements were obtained:  PEAK SYSTOLIC/END DIASTOLIC  RIGHT  ICA:                     69/24cm/sec  CCA:                     40/98JX/BJY  SYSTOLIC ICA/CCA RATIO:  7.82  DIASTOLIC ICA/CCA RATIO:  .6  ECA:                     70cm/sec  LEFT  ICA:                     57/11cm/sec  CCA:                     95/62ZH/YQM  SYSTOLIC ICA/CCA RATIO:  0.9  DIASTOLIC ICA/CCA RATIO: 0.6  ECA:                     70cm/sec  FINDINGS: RIGHT CAROTID ARTERY: No significant plaque accumulation or stenosis. Normal waveforms and color Doppler signal.  RIGHT VERTEBRAL ARTERY:  Normal flow direction and waveform.  LEFT CAROTID ARTERY: Mild plaque in the carotid bulb. No significant stenosis. Normal waveforms and color Doppler signal.  LEFT VERTEBRAL ARTERY: Normal flow direction and waveform.  IMPRESSION: Early left carotid bulb plaque resulting in less than 50% diameter stenosis. The exam does not exclude plaque ulceration or embolization. Continued surveillance recommended.    Electronically Signed   By: Lucrezia Europe M.D.   On: 06/02/2015 17:04    Management plans discussed with the patient, and she is in agreement.  CODE STATUS:     Code Status Orders        Start     Ordered   06/02/15 1339  Full code   Continuous  06/02/15 1338    Advance Directive Documentation        Most Recent Value   Type of Advance Directive  Living will   Pre-existing out of facility DNR order (yellow form or pink MOST form)     "MOST" Form in Place?        TOTAL TIME TAKING CARE OF THIS PATIENT: 35 minutes.    Loletha Grayer M.D on 06/03/2015 at 8:07 AM  Between 7am to 6pm - Pager - [phone]  After 6pm go to [email or web address] - password [email or web address] [facility]  Office  [phone]  CC: Primary care physician; Hortencia Pilar, MD

## 2015-06-03 NOTE — Plan of Care (Signed)
Problem: Discharge/Transitional Outcomes Goal: Other Discharge Outcomes/Goals Plan of care progress to goal: - No complaints of pain. - NIH score 0.  - VS WNL. - Ambulates with assist. Will continue to monitor.

## 2015-06-03 NOTE — Progress Notes (Signed)
MD making rounds. Discharge orders received. Telemetry Removed. IV removed. Prescriptions E-Prescribed to Pharmacy. Discharge paperwork provided, explained, signed and witnessed. Education handouts provided to patient. No unanswered questions. Escorted via wheelchair by [employer] representative. All belongings sent with patient and family.

## 2015-12-22 ENCOUNTER — Other Ambulatory Visit: Payer: Self-pay | Admitting: Family Medicine

## 2015-12-22 DIAGNOSIS — Z1231 Encounter for screening mammogram for malignant neoplasm of breast: Secondary | ICD-10-CM

## 2016-01-02 ENCOUNTER — Ambulatory Visit
Admission: RE | Admit: 2016-01-02 | Discharge: 2016-01-02 | Disposition: A | Discharge status: Home / Self Care | Payer: Medicare Other | Source: Ambulatory Visit | Attending: Family Medicine | Admitting: Family Medicine

## 2016-01-02 DIAGNOSIS — Z1231 Encounter for screening mammogram for malignant neoplasm of breast: Secondary | ICD-10-CM | POA: Insufficient documentation

## 2016-09-03 ENCOUNTER — Encounter: Payer: Self-pay | Admitting: *Deleted

## 2016-09-03 ENCOUNTER — Ambulatory Visit
Admit: 2016-09-03 | Discharge: 2016-09-03 | Disposition: A | Discharge status: Home / Self Care | Payer: Medicare Other | Attending: Family Medicine | Admitting: Family Medicine

## 2016-09-03 ENCOUNTER — Ambulatory Visit
Admission: EM | Admit: 2016-09-03 | Discharge: 2016-09-03 | Disposition: A | Discharge status: Home / Self Care | Payer: Medicare Other | Attending: Family Medicine | Admitting: Family Medicine

## 2016-09-03 ENCOUNTER — Ambulatory Visit (INDEPENDENT_AMBULATORY_CARE_PROVIDER_SITE_OTHER): Payer: Medicare Other

## 2016-09-03 DIAGNOSIS — Z7901 Long term (current) use of anticoagulants: Secondary | ICD-10-CM | POA: Diagnosis not present

## 2016-09-03 DIAGNOSIS — M25561 Pain in right knee: Secondary | ICD-10-CM

## 2016-09-03 DIAGNOSIS — S8391XA Sprain of unspecified site of right knee, initial encounter: Secondary | ICD-10-CM | POA: Insufficient documentation

## 2016-09-03 DIAGNOSIS — E119 Type 2 diabetes mellitus without complications: Secondary | ICD-10-CM | POA: Diagnosis not present

## 2016-09-03 DIAGNOSIS — R252 Cramp and spasm: Secondary | ICD-10-CM | POA: Insufficient documentation

## 2016-09-03 DIAGNOSIS — X58XXXA Exposure to other specified factors, initial encounter: Secondary | ICD-10-CM | POA: Insufficient documentation

## 2016-09-03 DIAGNOSIS — Z9049 Acquired absence of other specified parts of digestive tract: Secondary | ICD-10-CM | POA: Insufficient documentation

## 2016-09-03 DIAGNOSIS — I2510 Atherosclerotic heart disease of native coronary artery without angina pectoris: Secondary | ICD-10-CM | POA: Insufficient documentation

## 2016-09-03 DIAGNOSIS — Z8673 Personal history of transient ischemic attack (TIA), and cerebral infarction without residual deficits: Secondary | ICD-10-CM | POA: Insufficient documentation

## 2016-09-03 DIAGNOSIS — R52 Pain, unspecified: Secondary | ICD-10-CM | POA: Diagnosis not present

## 2016-09-03 DIAGNOSIS — Z86711 Personal history of pulmonary embolism: Secondary | ICD-10-CM | POA: Diagnosis not present

## 2016-09-03 DIAGNOSIS — I4891 Unspecified atrial fibrillation: Secondary | ICD-10-CM | POA: Insufficient documentation

## 2016-09-03 DIAGNOSIS — S86911A Strain of unspecified muscle(s) and tendon(s) at lower leg level, right leg, initial encounter: Secondary | ICD-10-CM

## 2016-09-03 DIAGNOSIS — M79604 Pain in right leg: Secondary | ICD-10-CM | POA: Insufficient documentation

## 2016-09-03 DIAGNOSIS — Z7982 Long term (current) use of aspirin: Secondary | ICD-10-CM | POA: Diagnosis not present

## 2016-09-03 LAB — CBC WITH DIFFERENTIAL/PLATELET
BASOS ABS: 0.1 10*3/uL (ref 0–0.1)
BASOS PCT: 1 %
EOS ABS: 0.1 10*3/uL (ref 0–0.7)
Eosinophils Relative: 2 %
HEMATOCRIT: 38.8 % (ref 35.0–47.0)
HEMOGLOBIN: 13.2 g/dL (ref 12.0–16.0)
Lymphocytes Relative: 19 %
Lymphs Abs: 1.3 10*3/uL (ref 1.0–3.6)
MCH: 31.6 pg (ref 26.0–34.0)
MCHC: 34 g/dL (ref 32.0–36.0)
MCV: 93 fL (ref 80.0–100.0)
MONOS PCT: 6 %
Monocytes Absolute: 0.4 10*3/uL (ref 0.2–0.9)
NEUTROS ABS: 4.8 10*3/uL (ref 1.4–6.5)
NEUTROS PCT: 72 %
Platelets: 200 10*3/uL (ref 150–440)
RBC: 4.17 MIL/uL (ref 3.80–5.20)
RDW: 13.1 % (ref 11.5–14.5)
WBC: 6.7 10*3/uL (ref 3.6–11.0)

## 2016-09-03 LAB — BASIC METABOLIC PANEL
ANION GAP: 8 (ref 5–15)
BUN: 12 mg/dL (ref 6–20)
CALCIUM: 8.6 mg/dL — AB (ref 8.9–10.3)
CHLORIDE: 100 mmol/L — AB (ref 101–111)
CO2: 27 mmol/L (ref 22–32)
CREATININE: 0.67 mg/dL (ref 0.44–1.00)
GFR calc non Af Amer: >60 mL/min (ref >60)
Glucose, Bld: 150 mg/dL — ABNORMAL HIGH (ref 65–99)
Potassium: 3.6 mmol/L (ref 3.5–5.1)
SODIUM: 135 mmol/L (ref 135–145)

## 2016-09-03 LAB — MAGNESIUM: MAGNESIUM: 2 mg/dL (ref 1.7–2.4)

## 2016-09-03 MED ORDER — MELOXICAM 7.5 MG PO TABS: 7.5000 mg | ORAL_TABLET | Freq: Every day | ORAL | 0 refills | Status: DC

## 2016-09-03 MED ORDER — MAGNESIUM OXIDE -MG SUPPLEMENT 400 (240 MG) MG PO TABS: 2.0000 | ORAL_TABLET | Freq: Every day | ORAL | 0 refills | Status: DC | PRN

## 2016-09-03 NOTE — ED Triage Notes (Signed)
Patient started having right knee pain yesterday. She does not have a history of right knee pain.

## 2016-09-03 NOTE — Discharge Instructions (Signed)
Patient is to have ultrasound of her right lower leg this afternoon. Nurse to provide time and location.

## 2016-09-03 NOTE — ED Provider Notes (Addendum)
[facility]    CSN: [id number] Arrival date & time: 09/03/16  [id number]     History   Chief Complaint Chief Complaint  Patient presents with  . Knee Pain    HPI [name] is a 75 y.o. female.   Patient's here because of right lower leg pain and right knee pain. Patient reports about 2:30-3:30 this morning she started having pain in her right leg. She reports difficulty and trouble going to the bathroom and had to hold onto things to make it to the bathroom. She also informs [place] that the pain in her right leg has gotten worse as it they has progressed and now she is almost unable to walk at all. She states that her left leg is one that usually gives her trouble in the past. She states that she has not had really any trouble with the right leg. She denies any trauma or any injury. She has had a history of some blood clots before in the past she's also had a history of A. fib and some other medical problems as well. She's had a history of coronary artery disease, A. fib diabetes mellitus diverticulitis, and PE. She has had a CVA as well before in the past. Mother had CHF CVA and father has had a history of COPD. She never smoked and she is allergic to statins and vecuronium.\     The history is provided by the patient. No language interpreter was used.  Knee Pain  Location:  Knee Time since incident:  8 hours Injury: no   Knee location:  R knee Pain details:    Quality:  Sharp   Radiates to:  R leg   Severity:  Severe   Onset quality:  Sudden   Timing:  Constant   Progression:  Worsening Chronicity:  New Dislocation: no   Foreign body present:  No foreign bodies Prior injury to area:  No Relieved by:  Nothing Worsened by:  Bearing weight and activity Ineffective treatments:  None tried Associated symptoms: no fever, no itching and no neck pain     Past Medical History:  Diagnosis Date  . A-fib ([place])   . Bradycardia   . Coronary artery disease   .  Diabetes mellitus without complication ([place])   . Diverticulitis   . Pulmonary embolism [facility])     Patient Active Problem List   Diagnosis Date Noted  . CVA (cerebral infarction) 06/02/2015    Past Surgical History:  Procedure Laterality Date  . ABDOMINAL HYSTERECTOMY    . BREAST BIOPSY Right    bx x2-neg  . CARDIAC SURGERY    . CHOLECYSTECTOMY    . EYE SURGERY      OB History    No data available       Home Medications    Prior to Admission medications   Medication Sig Start Date End Date Taking? Authorizing Provider  amLODipine (NORVASC) 2.5 MG tablet Take 2.5 mg by mouth daily.   Yes Historical Provider, MD  aspirin EC 81 MG tablet Take 81 mg by mouth at bedtime.   Yes Historical Provider, MD  metoprolol tartrate (LOPRESSOR) 25 MG tablet Take 25 mg by mouth 2 (two) times daily.   Yes Historical Provider, MD  Multiple Vitamins-Minerals (CENTRUM SILVER PO) Take 1 tablet by mouth daily.   Yes Historical Provider, MD  omega-3 acid ethyl esters (LOVAZA) 1 G capsule Take 1 g by mouth daily.   Yes Historical Provider, MD  pravastatin (  PRAVACHOL) 40 MG tablet Take 40 mg by mouth at bedtime.   Yes Historical Provider, MD  quinapril (ACCUPRIL) 40 MG tablet Take 40 mg by mouth daily.   Yes Historical Provider, MD  warfarin (COUMADIN) 4 MG tablet 4 mg every evening 06/03/15  Yes Loletha Grayer, MD  Magnesium Oxide 400 (240 Mg) MG TABS Take 2 tablets by mouth daily as needed (muscle cramping). 09/03/16   Frederich Cha, MD  meloxicam (MOBIC) 7.5 MG tablet Take 1 tablet (7.5 mg total) by mouth daily. Do not take w/motrin alleve or excedrin 09/03/16   Frederich Cha, MD  potassium chloride (K-DUR) 10 MEQ tablet One tablet daily for three days 06/03/15   Loletha Grayer, MD    Family History Family History  Problem Relation Age of Onset  . Congestive Heart Failure Mother   . CVA Mother   . COPD Father     Social History Social History  Substance Use Topics  . Smoking status: Never  Smoker  . Smokeless tobacco: Never Used  . Alcohol use No     Allergies   Statins and Vecuronium   Review of Systems Review of Systems  Constitutional: Negative for fever.  Musculoskeletal: Positive for gait problem, joint swelling and myalgias. Negative for neck pain.  Skin: Negative for itching.  All other systems reviewed and are negative.    Physical Exam Triage Vital Signs ED Triage Vitals  Enc Vitals Group     BP 09/03/16 0856 (!) 155/89     Pulse Rate 09/03/16 0856 63     Resp 09/03/16 0856 16     Temp 09/03/16 0856 98.3 F (36.8 C)     Temp Source 09/03/16 0856 Oral     SpO2 09/03/16 0856 99 %     Weight 09/03/16 0857 160 lb (72.6 kg)     Height 09/03/16 0857 5\' 1"  (1.549 m)     Head Circumference --      Peak Flow --      Pain Score 09/03/16 0903 9     Pain Loc --      Pain Edu? --      Excl. in [place]? --    No data found.   Updated Vital Signs BP (!) 155/89 (BP Location: Left Arm)   Pulse 63   Temp 98.3 F (36.8 C) (Oral)   Resp 16   Ht 5\' 1"  (1.549 m)   Wt 160 lb (72.6 kg)   SpO2 99%   BMI 30.23 kg/m   Visual Acuity Right Eye Distance:   Left Eye Distance:   Bilateral Distance:    Right Eye Near:   Left Eye Near:    Bilateral Near:     Physical Exam  Constitutional: She is oriented to person, place, and time. She appears well-developed.  HENT:  Head: Normocephalic and atraumatic.  Eyes: Pupils are equal, round, and reactive to light.  Neck: Normal range of motion.  Pulmonary/Chest: Effort normal.  Musculoskeletal: She exhibits tenderness. She exhibits no edema or deformity.       Right knee: She exhibits normal alignment, no LCL laxity and no MCL laxity. Tenderness found.       Legs: Patient needs his be stable however she is markedly tender the popliteal area and over the calf and gastrocnemius muscle. Negative [place]  Neurological: She is alert and oriented to person, place, and time. No cranial nerve deficit.  Skin: Skin is warm and  dry.  Psychiatric: She has a normal mood and  affect.  Vitals reviewed.    UC Treatments / Results  Labs (all labs ordered are listed, but only abnormal results are displayed) Labs Reviewed  BASIC METABOLIC PANEL - Abnormal; Notable for the following:       Result Value   Chloride 100 (*)    Glucose, Bld 150 (*)    Calcium 8.6 (*)    All other components within normal limits  CBC WITH DIFFERENTIAL/PLATELET  MAGNESIUM    EKG  EKG Interpretation None       Radiology Dg Knee Complete 4 Views Right  Result Date: 09/03/2016 CLINICAL DATA:  Pain. EXAM: RIGHT KNEE - COMPLETE 4+ VIEW COMPARISON:  No recent prior. FINDINGS: Small knee joint effusion. No acute bony abnormality identified. No evidence of fracture or dislocation. Deformity with old callus formation noted about the proximal tibia most likely secondary to old injury . IMPRESSION: 1. Small knee joint effusion. 2. Mild medial compartment degenerative change. Small loose bodies cannot be excluded . No acute bony or joint abnormality. Old healed fracture of the proximal fibular shaft noted. Electronically Signed   By: Marcello Moores  Register   On: 09/03/2016 10:09    Procedures Procedures (including critical care time)  Medications Ordered in UC Medications - No data to display   Initial Impression / Assessment and Plan / UC Course  I have reviewed the triage vital signs and the nursing notes.  Pertinent labs & imaging results that were available during my care of the patient were reviewed by me and considered in my medical decision making (see chart for details). Results for orders placed or performed during the hospital encounter of 09/03/16  CBC with Differential  Result Value Ref Range   WBC 6.7 3.6 - 11.0 K/uL   RBC 4.17 3.80 - 5.20 MIL/uL   Hemoglobin 13.2 12.0 - 16.0 g/dL   HCT 38.8 35.0 - 47.0 %   MCV 93.0 80.0 - 100.0 fL   MCH 31.6 26.0 - 34.0 pg   MCHC 34.0 32.0 - 36.0 g/dL   RDW 13.1 11.5 - 14.5 %   Platelets  200 150 - 440 K/uL   Neutrophils Relative % 72 %   Neutro Abs 4.8 1.4 - 6.5 K/uL   Lymphocytes Relative 19 %   Lymphs Abs 1.3 1.0 - 3.6 K/uL   Monocytes Relative 6 %   Monocytes Absolute 0.4 0.2 - 0.9 K/uL   Eosinophils Relative 2 %   Eosinophils Absolute 0.1 0 - 0.7 K/uL   Basophils Relative 1 %   Basophils Absolute 0.1 0 - 0.1 K/uL  Basic metabolic panel  Result Value Ref Range   Sodium 135 135 - 145 mmol/L   Potassium 3.6 3.5 - 5.1 mmol/L   Chloride 100 (L) 101 - 111 mmol/L   CO2 27 22 - 32 mmol/L   Glucose, Bld 150 (H) 65 - 99 mg/dL   BUN 12 6 - 20 mg/dL   Creatinine, Ser 0.67 0.44 - 1.00 mg/dL   Calcium 8.6 (L) 8.9 - 10.3 mg/dL   GFR calc non Af Amer >60 >60 mL/min   GFR calc Af Amer >60 >60 mL/min   Anion gap 8 5 - 15  Magnesium  Result Value Ref Range   Magnesium 2.0 1.7 - 2.4 mg/dL   Clinical Course    Patient was informed magnesium looks satisfactory however calciums on the low side. X-rays showed arthritic changes in the knee some old calcifications from a probable old injury but nothing acute. We'll try  to get ultrasound of the calf area and gastric images area today to make sure no popliteal issues present and make sure there is no blood clots present in the right lower leg. Think this is hopefully all just muscle cramping and will recommend mag oxide 2 tablets daily on a when necessary basis for cramping and Mobic 7.5 mg 1 tablet daily for pain follow-up PCP in 2-4 weeks if not better. Ultrasound is pending at this time this dictation I was asked positive that we'll change everything   Final Clinical Impressions(s) / UC Diagnoses   Final diagnoses:  Pain  Acute pain of right knee  Strain of right knee, initial encounter  Muscle cramping    New Prescriptions Discharge Medication List as of 09/03/2016 10:51 AM    START taking these medications   Details  Magnesium Oxide 400 (240 Mg) MG TABS Take 2 tablets by mouth daily as needed (muscle cramping)., Starting  Mon 09/03/2016, Normal    meloxicam (MOBIC) 7.5 MG tablet Take 1 tablet (7.5 mg total) by mouth daily. Do not take w/motrin alleve or excedrin, Starting Mon 09/03/2016, Normal         Note: This dictation was prepared with Dragon dictation along with smaller phrase technology. Any transcriptional errors that result from this process are unintentional.   Frederich Cha, MD 09/03/16 1138   Discussed with Ms. Marcelino Scot and a family member that her ultrasound was negative for clots she has what appears be a popliteal cyst that they may not ruptured. Ice anti-inflammatory and not better in a week's time she can see her doctor and possibly orthopedic referral for aspiration or possible Baker's cyst.   Frederich Cha, MD 09/03/16 1630

## 2016-12-18 ENCOUNTER — Other Ambulatory Visit: Payer: Self-pay | Admitting: Internal Medicine

## 2017-01-14 ENCOUNTER — Emergency Department: Payer: Medicare Other

## 2017-01-14 ENCOUNTER — Observation Stay
Admission: EM | Admit: 2017-01-14 | Discharge: 2017-01-15 | Disposition: A | Discharge status: Home / Self Care | Payer: Medicare Other | Attending: Internal Medicine | Admitting: Internal Medicine

## 2017-01-14 ENCOUNTER — Encounter: Payer: Self-pay | Admitting: Emergency Medicine

## 2017-01-14 DIAGNOSIS — R1013 Epigastric pain: Secondary | ICD-10-CM

## 2017-01-14 DIAGNOSIS — I4589 Other specified conduction disorders: Secondary | ICD-10-CM | POA: Diagnosis not present

## 2017-01-14 DIAGNOSIS — I2510 Atherosclerotic heart disease of native coronary artery without angina pectoris: Secondary | ICD-10-CM | POA: Diagnosis not present

## 2017-01-14 DIAGNOSIS — I442 Atrioventricular block, complete: Principal | ICD-10-CM | POA: Insufficient documentation

## 2017-01-14 DIAGNOSIS — E119 Type 2 diabetes mellitus without complications: Secondary | ICD-10-CM

## 2017-01-14 DIAGNOSIS — Z79899 Other long term (current) drug therapy: Secondary | ICD-10-CM | POA: Diagnosis not present

## 2017-01-14 DIAGNOSIS — E876 Hypokalemia: Secondary | ICD-10-CM | POA: Insufficient documentation

## 2017-01-14 DIAGNOSIS — Z7982 Long term (current) use of aspirin: Secondary | ICD-10-CM | POA: Diagnosis not present

## 2017-01-14 DIAGNOSIS — I480 Paroxysmal atrial fibrillation: Secondary | ICD-10-CM | POA: Diagnosis not present

## 2017-01-14 DIAGNOSIS — Z86711 Personal history of pulmonary embolism: Secondary | ICD-10-CM | POA: Diagnosis not present

## 2017-01-14 DIAGNOSIS — E1165 Type 2 diabetes mellitus with hyperglycemia: Secondary | ICD-10-CM | POA: Diagnosis not present

## 2017-01-14 DIAGNOSIS — I10 Essential (primary) hypertension: Secondary | ICD-10-CM | POA: Diagnosis not present

## 2017-01-14 DIAGNOSIS — Z7901 Long term (current) use of anticoagulants: Secondary | ICD-10-CM | POA: Insufficient documentation

## 2017-01-14 DIAGNOSIS — Z951 Presence of aortocoronary bypass graft: Secondary | ICD-10-CM | POA: Diagnosis not present

## 2017-01-14 LAB — BASIC METABOLIC PANEL
ANION GAP: 11 (ref 5–15)
BUN: 14 mg/dL (ref 6–20)
CALCIUM: 9.7 mg/dL (ref 8.9–10.3)
CO2: 26 mmol/L (ref 22–32)
Chloride: 104 mmol/L (ref 101–111)
Creatinine, Ser: 0.74 mg/dL (ref 0.44–1.00)
GLUCOSE: 123 mg/dL — AB (ref 65–99)
Potassium: 3.5 mmol/L (ref 3.5–5.1)
SODIUM: 141 mmol/L (ref 135–145)

## 2017-01-14 LAB — TROPONIN I: Troponin I: <0.03 ng/mL (ref <0.03)

## 2017-01-14 LAB — CBC
HCT: 45.1 % (ref 35.0–47.0)
Hemoglobin: 15.5 g/dL (ref 12.0–16.0)
MCH: 31.3 pg (ref 26.0–34.0)
MCHC: 34.3 g/dL (ref 32.0–36.0)
MCV: 91.3 fL (ref 80.0–100.0)
Platelets: 227 10*3/uL (ref 150–440)
RBC: 4.94 MIL/uL (ref 3.80–5.20)
RDW: 14.3 % (ref 11.5–14.5)
WBC: 8.3 10*3/uL (ref 3.6–11.0)

## 2017-01-14 LAB — PROTIME-INR
INR: 1.85
PROTHROMBIN TIME: 21.6 s — AB (ref 11.4–15.2)

## 2017-01-14 MED ORDER — SODIUM CHLORIDE 0.9% FLUSH
3.0000 mL | Freq: Two times a day (BID) | INTRAVENOUS | Status: DC
  Administered 2017-01-14 – 2017-01-15 (×2): 3 mL via INTRAVENOUS

## 2017-01-14 MED ORDER — MAGNESIUM OXIDE 400 (241.3 MG) MG PO TABS: 800.0000 mg | ORAL_TABLET | Freq: Every day | ORAL | Status: DC | PRN

## 2017-01-14 MED ORDER — ACETAMINOPHEN 325 MG PO TABS: 650.0000 mg | ORAL_TABLET | Freq: Four times a day (QID) | ORAL | Status: DC | PRN

## 2017-01-14 MED ORDER — QUINAPRIL HCL 10 MG PO TABS: 40.0000 mg | ORAL_TABLET | Freq: Two times a day (BID) | ORAL | Status: DC

## 2017-01-14 MED ORDER — ASPIRIN EC 81 MG PO TBEC
81.0000 mg | DELAYED_RELEASE_TABLET | Freq: Every day | ORAL | Status: DC
  Administered 2017-01-14: 81 mg via ORAL
  Filled 2017-01-14: qty 1

## 2017-01-14 MED ORDER — LISINOPRIL 20 MG PO TABS
40.0000 mg | ORAL_TABLET | Freq: Two times a day (BID) | ORAL | Status: DC
  Administered 2017-01-14 – 2017-01-15 (×2): 40 mg via ORAL
  Filled 2017-01-14 (×2): qty 2

## 2017-01-14 MED ORDER — ACETAMINOPHEN 650 MG RE SUPP: 650.0000 mg | Freq: Four times a day (QID) | RECTAL | Status: DC | PRN

## 2017-01-14 MED ORDER — ONDANSETRON HCL 4 MG PO TABS: 4.0000 mg | ORAL_TABLET | Freq: Four times a day (QID) | ORAL | Status: DC | PRN

## 2017-01-14 MED ORDER — PRAVASTATIN SODIUM 40 MG PO TABS
40.0000 mg | ORAL_TABLET | Freq: Every day | ORAL | Status: DC
  Administered 2017-01-14: 40 mg via ORAL
  Filled 2017-01-14: qty 1

## 2017-01-14 MED ORDER — PANTOPRAZOLE SODIUM 40 MG PO TBEC
40.0000 mg | DELAYED_RELEASE_TABLET | Freq: Every day | ORAL | Status: DC
  Administered 2017-01-14 – 2017-01-15 (×2): 40 mg via ORAL
  Filled 2017-01-14: qty 1

## 2017-01-14 MED ORDER — WARFARIN SODIUM 5 MG PO TABS: 5.0000 mg | ORAL_TABLET | Freq: Every day | ORAL | Status: DC

## 2017-01-14 MED ORDER — ONDANSETRON HCL 4 MG/2ML IJ SOLN: 4.0000 mg | Freq: Four times a day (QID) | INTRAMUSCULAR | Status: DC | PRN

## 2017-01-14 NOTE — ED Provider Notes (Addendum)
[facility] Emergency Department Provider Note  ____________________________________________   I have reviewed the triage vital signs and the nursing notes.   HISTORY  Chief Complaint Palpitations    HPI [name] is a 76 y.o. female who presents today complaining ofpalpitations today she has a history of paroxysmal fibrillation. She states that since last night she has had which she was to be A. fib. She denies chest and short of breath nausea or vomiting. She is on Coumadin for this. This is happened off and on for years. She denies any symptoms with that she was sent in for further evaluation by her primary care doctor because her EKG machine wouldn't work      Past Medical History:  Diagnosis Date  . A-fib ([place])   . Bradycardia   . Coronary artery disease   . Diabetes mellitus without complication ([place])   . Diverticulitis   . Pulmonary embolism [facility])     Patient Active Problem List   Diagnosis Date Noted  . CVA (cerebral infarction) 06/02/2015    Past Surgical History:  Procedure Laterality Date  . ABDOMINAL HYSTERECTOMY    . BREAST BIOPSY Right    bx x2-neg  . CARDIAC SURGERY    . CHOLECYSTECTOMY    . EYE SURGERY      Prior to Admission medications   Medication Sig Start Date End Date Taking? Authorizing Provider  amLODipine (NORVASC) 2.5 MG tablet Take 2.5 mg by mouth daily.    Historical Provider, MD  aspirin EC 81 MG tablet Take 81 mg by mouth at bedtime.    Historical Provider, MD  Magnesium Oxide 400 (240 Mg) MG TABS Take 2 tablets by mouth daily as needed (muscle cramping). 09/03/16   Frederich Cha, MD  meloxicam (MOBIC) 7.5 MG tablet Take 1 tablet (7.5 mg total) by mouth daily. Do not take w/motrin alleve or excedrin 09/03/16   Frederich Cha, MD  metoprolol tartrate (LOPRESSOR) 25 MG tablet Take 25 mg by mouth 2 (two) times daily.    Historical Provider, MD  Multiple Vitamins-Minerals (CENTRUM SILVER PO) Take 1 tablet by  mouth daily.    Historical Provider, MD  omega-3 acid ethyl esters (LOVAZA) 1 G capsule Take 1 g by mouth daily.    Historical Provider, MD  potassium chloride (K-DUR) 10 MEQ tablet One tablet daily for three days 06/03/15   Loletha Grayer, MD  pravastatin (PRAVACHOL) 40 MG tablet Take 40 mg by mouth at bedtime.    Historical Provider, MD  quinapril (ACCUPRIL) 40 MG tablet Take 40 mg by mouth daily.    Historical Provider, MD  warfarin (COUMADIN) 4 MG tablet 4 mg every evening 06/03/15   Loletha Grayer, MD    Allergies Statins and Vecuronium  Family History  Problem Relation Age of Onset  . Congestive Heart Failure Mother   . CVA Mother   . COPD Father     Social History Social History  Substance Use Topics  . Smoking status: Never Smoker  . Smokeless tobacco: Never Used  . Alcohol use No    Review of Systems Constitutional: No fever/chills Eyes: No visual changes. ENT: No sore throat. No stiff neck no neck pain Cardiovascular: Denies chest pain. Respiratory: Denies shortness of breath. Gastrointestinal:   no vomiting.  No diarrhea.  No constipation. Genitourinary: Negative for dysuria. Musculoskeletal: Negative lower extremity swelling Skin: Negative for rash. Neurological: Negative for severe headaches, focal weakness or numbness. 10-point ROS otherwise negative.  ____________________________________________   PHYSICAL  EXAM:  VITAL SIGNS: ED Triage Vitals  Enc Vitals Group     BP 01/14/17 1454 (!) 146/106     Pulse Rate 01/14/17 1454 85     Resp 01/14/17 1454 16     Temp 01/14/17 1454 97.9 F (36.6 C)     Temp Source 01/14/17 1454 Oral     SpO2 01/14/17 1454 99 %     Weight 01/14/17 1451 150 lb (68 kg)     Height 01/14/17 1451 5\' 1"  (1.549 m)     Head Circumference --      Peak Flow --      Pain Score 01/14/17 1451 0     Pain Loc --      Pain Edu? --      Excl. in [place]? --     Constitutional: Alert and oriented. Well appearing and in no acute  distress. Eyes: Conjunctivae are normal. PERRL. EOMI. Head: Atraumatic. Nose: No congestion/rhinnorhea. Mouth/Throat: Mucous membranes are moist.  Oropharynx non-erythematous. Neck: No stridor.   Nontender with no meningismus Cardiovascular: Normal rate, Irregularly irregular rhythm. Grossly normal heart sounds.  Good peripheral circulation. Respiratory: Normal respiratory effort.  No retractions. Lungs CTAB. Abdominal: Soft and nontender. No distention. No guarding no rebound Back:  There is no focal tenderness or step off.  there is no midline tenderness there are no lesions noted. there is no CVA tenderness  Musculoskeletal: No lower extremity tenderness, no upper extremity tenderness. No joint effusions, no DVT signs strong distal pulses no edema Neurologic:  Normal speech and language. No gross focal neurologic deficits are appreciated.  Skin:  Skin is warm, dry and intact. No rash noted. Psychiatric: Mood and affect are normal. Speech and behavior are normal.  ____________________________________________   LABS (all labs ordered are listed, but only abnormal results are displayed)  Labs Reviewed  BASIC METABOLIC PANEL - Abnormal; Notable for the following:       Result Value   Glucose, Bld 123 (*)    All other components within normal limits  PROTIME-INR - Abnormal; Notable for the following:    Prothrombin Time 21.6 (*)    All other components within normal limits  TROPONIN I  CBC   ____________________________________________  EKG  I personally interpreted any EKGs ordered by me or triage 2 EKGs were obtained on this patient. The first shows what is likely sinus rhythm with occasional PVCs, baseline artifact limits  Interpretation  Second EKG was therefore obtained, initially, it appeared to be in intermittent atrial fibrillation now as I look at it, I'm concerned there might be a third-degree block. We will discuss with cardiology  _____________________________  RADIOLOGY  I reviewed any imaging ordered by me or triage that were performed during my shift and, if possible, patient and/or family made aware of any abnormal findings. ____________________________________________   PROCEDURES  Procedure(s) performed: None  Procedures  Critical Care performed: CRITICAL CARE Performed by: Schuyler Amor   Total critical care time: 45 minutes  Critical care time was exclusive of separately billable procedures and treating other patients.  Critical care was necessary to treat or prevent imminent or life-threatening deterioration.  Critical care was time spent personally by me on the following activities: development of treatment plan with patient and/or surrogate as well as nursing, discussions with consultants, evaluation of patient's response to treatment, examination of patient, obtaining history from patient or surrogate, ordering and performing treatments and interventions, ordering and review of laboratory studies, ordering and review of radiographic studies,  pulse oximetry and re-evaluation of patient's condition.   ____________________________________________   INITIAL IMPRESSION / ASSESSMENT AND PLAN / ED COURSE  Pertinent labs & imaging results that were available during my care of the patient were reviewed by me and considered in my medical decision making (see chart for details).  Patient with a history of atrial fibrillation resents today with palpitations. I read of her second EKG shows possible third-degree AV block. She is asymptomatic however. We will discuss with cardiology.  ----------------------------------------- 7:49 PM on 01/14/2017 -----------------------------------------  Discussed with Dr. Ubaldo Glassing of cardiology, who agrees with plan. He feels that this could just be a temporary issue that may be will not require pacemaker. He suggested that the patient could either go home or be  admitted. I certainly would feel more comfortable admitting the patient for observation given AV dissociation, she is not having any symptoms fortunately and her rate is controlled. We will admit. Hospitalist agrees w/ plan.  .    ____________________________________________   FINAL CLINICAL IMPRESSION(S) / ED DIAGNOSES  Final diagnoses:  None      This chart was dictated using voice recognition software.  Despite best efforts to proofread,  errors can occur which can change meaning.      Schuyler Amor, MD 01/14/17 [place], MD 01/14/17 [phone]

## 2017-01-14 NOTE — ED Triage Notes (Signed)
Seen by PCP today.  Patient c/o feeling palpitations since last night.  Referred to ED by PCP.

## 2017-01-14 NOTE — H&P (Signed)
[place] [facility] [place] NAME: [name]    MR#:  [id number]  DATE OF BIRTH:  1941/08/09  DATE OF ADMISSION:  01/14/2017  PRIMARY CARE PHYSICIAN: Hortencia Pilar, MD   REQUESTING/REFERRING PHYSICIAN:   CHIEF COMPLAINT:   Chief Complaint  Patient presents with  . Palpitations    HISTORY OF PRESENT ILLNESS: [name]  is a 76 y.o. female with a known history of Atrial fibrillation, on anticoagulation. On Coumadin , coronary artery disease, diabetes, diverticulitis, history of pulmonary embolism, who presents to the hospital with complaints of palpitations, restarted last night. The patient denied any chest pains, feeling presyncopal, dizziness, lightheadedness or shortness of breath. On arrival to the hospital, she was noted to have EKG consistent with AV dissociation, heart rate in 70s to 80s. Hospitalist services were contacted for admission. Patient also is complaining of upper abdominal pain, which she described as achiness, soreness, constant, with no radiation, alleviating or aggravating factors, patient feels full after she eats. She also admits of gastric esophageal reflux .  PAST MEDICAL HISTORY:   Past Medical History:  Diagnosis Date  . A-fib ([place])   . Bradycardia   . Coronary artery disease   . Diabetes mellitus without complication ([place])   . Diverticulitis   . Pulmonary embolism ([place])     PAST SURGICAL HISTORY: Past Surgical History:  Procedure Laterality Date  . ABDOMINAL HYSTERECTOMY    . BREAST BIOPSY Right    bx x2-neg  . CARDIAC SURGERY    . CHOLECYSTECTOMY    . EYE SURGERY      SOCIAL HISTORY:  Social History  Substance Use Topics  . Smoking status: Never Smoker  . Smokeless tobacco: Never Used  . Alcohol use No    FAMILY HISTORY:  Family History  Problem Relation Age of Onset  . Congestive Heart Failure Mother   . CVA Mother   . COPD Father     DRUG ALLERGIES:  Allergies  Allergen  Reactions  . Statins Other (See Comments)    Muscle aches  . Vecuronium Rash    Review of Systems  Constitutional: Negative for chills, fever and weight loss.  HENT: Negative for congestion.   Eyes: Negative for blurred vision and double vision.  Respiratory: Negative for cough, sputum production, shortness of breath and wheezing.   Cardiovascular: Positive for palpitations. Negative for chest pain, orthopnea, leg swelling and PND.  Gastrointestinal: Positive for abdominal pain. Negative for blood in stool, constipation, diarrhea, nausea and vomiting.  Genitourinary: Negative for dysuria, frequency, hematuria and urgency.  Musculoskeletal: Negative for falls.  Neurological: Negative for dizziness, tremors, focal weakness and headaches.  Endo/Heme/Allergies: Does not bruise/bleed easily.  Psychiatric/Behavioral: Negative for depression. The patient does not have insomnia.     MEDICATIONS AT HOME:  Prior to Admission medications   Medication Sig Start Date End Date Taking? Authorizing Provider  amLODipine (NORVASC) 2.5 MG tablet Take 2.5 mg by mouth daily.   Yes Historical Provider, MD  aspirin EC 81 MG tablet Take 81 mg by mouth at bedtime.   Yes Historical Provider, MD  Co-Enzyme Q10 200 MG CAPS Take 1 capsule by mouth daily.   Yes Historical Provider, MD  metoprolol tartrate (LOPRESSOR) 25 MG tablet Take 25 mg by mouth 2 (two) times daily.   Yes Historical Provider, MD  Multiple Vitamins-Minerals (CENTRUM SILVER PO) Take 1 tablet by mouth daily.   Yes Historical Provider, MD  Nutritional Supplements (SALMON OIL) CAPS  Take 1 capsule by mouth daily.   Yes Historical Provider, MD  pravastatin (PRAVACHOL) 40 MG tablet Take 40 mg by mouth at bedtime.   Yes Historical Provider, MD  quinapril (ACCUPRIL) 40 MG tablet Take 40 mg by mouth daily.   Yes Historical Provider, MD  warfarin (COUMADIN) 4 MG tablet 4 mg every evening 06/03/15  Yes Loletha Grayer, MD  Magnesium Oxide 400 (240 Mg) MG  TABS Take 2 tablets by mouth daily as needed (muscle cramping). 09/03/16   Frederich Cha, MD  meloxicam (MOBIC) 7.5 MG tablet Take 1 tablet (7.5 mg total) by mouth daily. Do not take w/motrin alleve or excedrin 09/03/16   Frederich Cha, MD  potassium chloride (K-DUR) 10 MEQ tablet One tablet daily for three days Patient not taking: Reported on 01/14/2017 06/03/15   Loletha Grayer, MD      PHYSICAL EXAMINATION:   VITAL SIGNS: Blood pressure (!) 141/99, pulse 77, temperature 97.9 F (36.6 C), temperature source Oral, resp. rate 17, height 5\' 1"  (1.549 m), weight 68 kg (150 lb), SpO2 98 %.  GENERAL:  76 y.o.-year-old patient lying in the bed with no acute distress. [place]illy in the emergency room EYES: Pupils equal, round, reactive to light and accommodation. No scleral icterus. Extraocular muscles intact.  HEENT: Head atraumatic, normocephalic. Oropharynx and nasopharynx clear.  NECK:  Supple, no jugular venous distention. No thyroid enlargement, no tenderness.  LUNGS: Normal breath sounds bilaterally, no wheezing, rales,rhonchi or crepitation. No use of accessory muscles of respiration.  CARDIOVASCULAR: S1, S2 normal. No murmurs, rubs, or gallops.  ABDOMEN: Soft, tender in the epigastric area but no rebound or guarding, nondistended. Bowel sounds present. No organomegaly or mass.  EXTREMITIES: No pedal edema, cyanosis, or clubbing.  NEUROLOGIC: Cranial nerves II through XII are intact. Muscle strength 5/5 in all extremities. Sensation intact. Gait not checked.  PSYCHIATRIC: The patient is alert and oriented x 3.  SKIN: No obvious rash, lesion, or ulcer.   LABORATORY PANEL:   CBC  Recent Labs Lab 01/14/17 1723  WBC 8.3  HGB 15.5  HCT 45.1  PLT 227  MCV 91.3  MCH 31.3  MCHC 34.3  RDW 14.3   ------------------------------------------------------------------------------------------------------------------  Chemistries   Recent Labs Lab 01/14/17 1503  NA 141  K 3.5  CL 104  CO2 26   GLUCOSE 123*  BUN 14  CREATININE 0.74  CALCIUM 9.7   ------------------------------------------------------------------------------------------------------------------  Cardiac Enzymes  Recent Labs Lab 01/14/17 1503  TROPONINI <0.03   ------------------------------------------------------------------------------------------------------------------  RADIOLOGY: Dg Chest 2 View  Result Date: 01/14/2017 CLINICAL DATA:  75 year old acute onset of palpitations that began last night. Current history of atrial fibrillation, coronary artery disease post CABG, and diabetes. Personal history of pulmonary embolism. EXAM: CHEST  2 VIEW COMPARISON:  10/02/2014, 01/04/2014 and earlier. FINDINGS: Prior sternotomy for CABG. Cardiac silhouette normal in size, unchanged. Thoracic aorta atherosclerotic, unchanged. Hilar and mediastinal contours otherwise unremarkable. Linear areas of scar in the lingula and left lower lobe, unchanged. Lungs otherwise clear. No confluent airspace consolidation. No pleural effusions. Normal pulmonary vascularity. Mild degenerative changes involving the lower thoracic spine. IMPRESSION: No acute cardiopulmonary disease. Stable scarring involving the lingula and left lower lobe. Electronically Signed   By: Evangeline Dakin M.D.   On: 01/14/2017 15:22    EKG: Orders placed or performed during the hospital encounter of 01/14/17  . ED EKG within 10 minutes  . ED EKG within 10 minutes  . EKG 12-Lead  . EKG 12-Lead  . EKG 12-Lead  .  EKG 12-Lead  . EKG 12-Lead  . EKG 12-Lead    IMPRESSION AND PLAN:  Active Problems:   AV dissociation   Epigastric abdominal pain   Malignant essential hypertension   Hyperglycemia  #1 AV dissociation of unclear etiology, however, the patient to medical floor. Hold beta blocker, get TSH, cardiology consultation, continue on telemetry, follow cardiac enzymes #2. Epigastric abdominal pain, hold nonsteroidal anti-vomiting, medications,  initiate patient on Protonix #3. Malignant essential hypertension, advance lisinopril to 40 mg twice daily dose, hold beta blocker #4. Diabetes mellitus, continue outpatient medications, sliding scale insulin, #5. History of atrial fibrillation, continue Coumadin therapy, watch for tachycardia due to holding beta blocker   All the records are reviewed and case discussed with ED provider. Management plans discussed with the patient, family and they are in agreement.  CODE STATUS: Code Status History    Date Active Date Inactive Code Status Order ID Comments User Context   06/02/2015  1:38 PM 06/03/2015  2:12 PM Full Code [id number]  Henreitta Leber, MD Inpatient       TOTAL TIME TAKING CARE OF THIS PATIENT: 50 minutes.    Theodoro Grist M.D on 01/14/2017 at 8:39 PM  Between 7am to 6pm - Pager - [phone] After 6pm go to [email or web address] - password [email or web address] [facility]  Office  [phone]  CC: Primary care physician; Hortencia Pilar, MD

## 2017-01-15 LAB — BASIC METABOLIC PANEL
Anion gap: 10 (ref 5–15)
BUN: 16 mg/dL (ref 6–20)
CO2: 26 mmol/L (ref 22–32)
CREATININE: 0.79 mg/dL (ref 0.44–1.00)
Calcium: 8.6 mg/dL — ABNORMAL LOW (ref 8.9–10.3)
Chloride: 106 mmol/L (ref 101–111)
Glucose, Bld: 116 mg/dL — ABNORMAL HIGH (ref 65–99)
POTASSIUM: 3.4 mmol/L — AB (ref 3.5–5.1)
SODIUM: 142 mmol/L (ref 135–145)

## 2017-01-15 LAB — CBC
HCT: 39.8 % (ref 35.0–47.0)
Hemoglobin: 13.6 g/dL (ref 12.0–16.0)
MCH: 31.1 pg (ref 26.0–34.0)
MCHC: 34.2 g/dL (ref 32.0–36.0)
MCV: 90.9 fL (ref 80.0–100.0)
PLATELETS: 214 10*3/uL (ref 150–440)
RBC: 4.38 MIL/uL (ref 3.80–5.20)
RDW: 14.6 % — AB (ref 11.5–14.5)
WBC: 6.1 10*3/uL (ref 3.6–11.0)

## 2017-01-15 LAB — TROPONIN I

## 2017-01-15 LAB — TSH: TSH: 4.896 u[IU]/mL — ABNORMAL HIGH (ref 0.350–4.500)

## 2017-01-15 LAB — GLUCOSE, CAPILLARY: GLUCOSE-CAPILLARY: 101 mg/dL — AB (ref 65–99)

## 2017-01-15 MED ORDER — MAGNESIUM OXIDE -MG SUPPLEMENT 400 (240 MG) MG PO TABS: ORAL_TABLET | ORAL | 0 refills | Status: DC

## 2017-01-15 MED ORDER — POTASSIUM CHLORIDE CRYS ER 20 MEQ PO TBCR
20.0000 meq | EXTENDED_RELEASE_TABLET | Freq: Two times a day (BID) | ORAL | Status: DC
  Administered 2017-01-15: 20 meq via ORAL
  Filled 2017-01-15: qty 1

## 2017-01-15 MED ORDER — PANTOPRAZOLE SODIUM 40 MG PO TBEC: 40.0000 mg | DELAYED_RELEASE_TABLET | Freq: Every day | ORAL | 0 refills | Status: AC

## 2017-01-15 MED ORDER — POTASSIUM CHLORIDE ER 10 MEQ PO TBCR: EXTENDED_RELEASE_TABLET | ORAL | 0 refills | Status: DC

## 2017-01-15 MED ORDER — DILTIAZEM HCL ER COATED BEADS 120 MG PO CP24: 120.0000 mg | ORAL_CAPSULE | Freq: Every day | ORAL | 0 refills | Status: AC

## 2017-01-15 NOTE — Care Management (Signed)
No discharge needs identified by members of the care team 

## 2017-01-15 NOTE — Progress Notes (Signed)
Patient admitted for AV dissociation. No complaints of dizziness or pain. Patient has no history of falls. Heart rate is in the 80's. Will continue to monitor and assess. No complications noted at this time.

## 2017-01-15 NOTE — Progress Notes (Signed)
Discharge instructions explained to pt / verbalized an understanding/ iv and tele removed/ RX given to pt/ will transport off unit via wheelchair when ride arrives 

## 2017-01-15 NOTE — Plan of Care (Signed)
Problem: Activity: Goal: Ability to tolerate increased activity will improve Outcome: Completed/Met Date Met: 01/15/17 Pt has converted back to NSR on tele/ no more reports of palpitations/ cardiology following

## 2017-01-15 NOTE — Discharge Summary (Signed)
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 A-fib ([place])   . Bradycardia   . Coronary artery disease   . Diabetes mellitus without complication ([place])   . Diverticulitis   . Pulmonary embolism [facility])     HOSPITAL COURSE:   1. Paroxysmal atrial fibrillation that converted over to normal sinus rhythm. Case discussed with patient and Dr. Ubaldo Glassing cardiology. The patient states she has been off her metoprolol for couple months. Dr. Ubaldo Glassing does not see any A-V dissociation or block. We'll start a low-dose Cardizem CD upon discharge. INR slightly low today go back to 4 mg daily of Coumadin. Check INR weekly basis. 2. Hypokalemia. Replace potassium orally. Also replace magnesium daily. 3. Epigastric abdominal pain has resolved. Protonix prescribed 4. Essential hypertension. Continue lisinopril and added Cardizem CD 5. History of Type 2 diabetes mellitus on diet control  DISCHARGE CONDITIONS:   Satisfactory  CONSULTS OBTAINED:  Treatment Team:  Teodoro Spray, MD  DRUG ALLERGIES:   Allergies  Allergen Reactions  . Statins Other (See Comments)    Muscle aches  . Vecuronium Rash    DISCHARGE MEDICATIONS:   Current Discharge Medication List    START taking these medications   Details  diltiazem (CARDIZEM CD) 120 MG 24 hr capsule Take 1 capsule (120 mg total) by mouth daily. Qty: 30 capsule, Refills: 0    pantoprazole (PROTONIX) 40 MG tablet Take 1 tablet (40 mg total) by mouth daily. Qty: 30 tablet, Refills: 0      CONTINUE these medications which have CHANGED    Details  Magnesium Oxide 400 (240 Mg) MG TABS One tablet daily Qty: 30 tablet, Refills: 0    potassium chloride (K-DUR) 10 MEQ tablet One tablet daily for five Qty: 5 tablet, Refills: 0      CONTINUE these medications which have NOT CHANGED   Details  aspirin EC 81 MG tablet Take 81 mg by mouth at bedtime.    Co-Enzyme Q10 200 MG CAPS Take 1 capsule by mouth daily.    Multiple Vitamins-Minerals (CENTRUM SILVER PO) Take 1 tablet by mouth daily.    Nutritional Supplements (SALMON OIL) CAPS Take 1 capsule by mouth daily.    pravastatin (PRAVACHOL) 40 MG tablet Take 40 mg by mouth at bedtime.    quinapril (ACCUPRIL) 40 MG tablet Take 40 mg by mouth daily.    warfarin (COUMADIN) 4 MG tablet 4 mg every evening Qty: 30 tablet, Refills: 0      STOP taking these medications     amLODipine (NORVASC) 2.5 MG tablet      metoprolol tartrate (LOPRESSOR) 25 MG tablet      meloxicam (MOBIC) 7.5 MG tablet          DISCHARGE INSTRUCTIONS:   Follow-up Dr. Nehemiah Massed one week Follow-up PMD one week   If you experience worsening of your admission symptoms, develop shortness of breath, life threatening emergency, suicidal or homicidal thoughts you must seek medical attention immediately by calling 911 or calling your MD immediately  if symptoms less severe.  You Must read complete instructions/literature  along with all the possible adverse reactions/side effects for all the Medicines you take and that have been prescribed to you. Take any new Medicines after you have completely understood and accept all the possible adverse reactions/side effects.   Please note  You were cared for by a hospitalist during your hospital stay. If you have any questions about your discharge medications or the care you received while you were in the hospital after you are discharged, you can call the unit and asked to speak with the hospitalist on call if the hospitalist that took care of you is not available.  Once you are discharged, your primary care physician will handle any further medical issues. Please note that NO REFILLS for any discharge medications will be authorized once you are discharged, as it is imperative that you return to your primary care physician (or establish a relationship with a primary care physician if you do not have one) for your aftercare needs so that they can reassess your need for medications and monitor your lab values.    Today   CHIEF COMPLAINT:   Chief Complaint  Patient presents with  . Palpitations    HISTORY OF PRESENT ILLNESS:  [name]  is a 76 y.o. female presented with palpitations and found to be in atrial fibrillation   VITAL SIGNS:  Blood pressure 137/66, pulse 67, temperature 98.6 F (37 C), temperature source Oral, resp. rate 18, height 5\' 1"  (1.549 m), weight 69.7 kg (153 lb 10.6 oz), SpO2 94 %.    PHYSICAL EXAMINATION:  GENERAL:  76 y.o.-year-old patient lying in the bed with no acute distress.  EYES: Pupils equal, round, reactive to light and accommodation. No scleral icterus. Extraocular muscles intact.  HEENT: Head atraumatic, normocephalic. Oropharynx and nasopharynx clear.  NECK:  Supple, no jugular venous distention. No thyroid enlargement, no tenderness.  LUNGS: Normal breath sounds bilaterally, no wheezing, rales,rhonchi or crepitation. No use of accessory muscles of respiration.  CARDIOVASCULAR: S1, S2 normal. No murmurs, rubs, or gallops.  ABDOMEN: Soft, non-tender, non-distended. Bowel sounds present. No organomegaly or mass.  EXTREMITIES: No pedal edema, cyanosis, or clubbing.  NEUROLOGIC: Cranial nerves II through XII are intact. Muscle strength 5/5 in all extremities. Sensation intact. Gait not checked.  PSYCHIATRIC: The patient is alert and oriented x 3.  SKIN: No obvious rash, lesion, or ulcer.   DATA REVIEW:   CBC  Recent Labs Lab 01/15/17 0438  WBC 6.1  HGB 13.6  HCT 39.8  PLT 214    Chemistries    Recent Labs Lab 01/15/17 0438  NA 142  K 3.4*  CL 106  CO2 26  GLUCOSE 116*  BUN 16  CREATININE 0.79  CALCIUM 8.6*    Cardiac Enzymes  Recent Labs Lab 01/15/17 0438  TROPONINI <0.03      RADIOLOGY:  Dg Chest 2 View  Result Date: 01/14/2017 CLINICAL DATA:  76 year old acute onset of palpitations that began last night. Current history of atrial fibrillation, coronary artery disease post CABG, and diabetes. Personal history of pulmonary embolism. EXAM: CHEST  2 VIEW COMPARISON:  10/02/2014, 01/04/2014 and earlier. FINDINGS: Prior sternotomy for CABG. Cardiac silhouette normal in size, unchanged. Thoracic aorta atherosclerotic, unchanged. Hilar and mediastinal contours otherwise unremarkable. Linear areas of scar in the lingula and left lower lobe, unchanged. Lungs otherwise clear. No confluent airspace consolidation. No pleural effusions. Normal pulmonary vascularity. Mild degenerative changes involving the lower thoracic spine. IMPRESSION: No acute cardiopulmonary disease. Stable scarring involving the lingula and left lower  lobe. Electronically Signed   By: Evangeline Dakin M.D.   On: 01/14/2017 15:22    Management plans discussed with the patient, and she is agreement.  CODE STATUS:     Code Status Orders        Start     Ordered   01/14/17 2329  Full code  Continuous     01/14/17 2328    Code Status History    Date Active Date Inactive Code Status Order ID Comments User Context   06/02/2015  1:38 PM 06/03/2015  2:12 PM Full Code [id number]  Henreitta Leber, MD Inpatient      TOTAL TIME TAKING CARE OF THIS PATIENT: 35 minutes.    Loletha Grayer M.D on 01/15/2017 at 2:16 PM  Between 7am to 6pm - Pager - [phone]  After 6pm go to [email or web address] - password [email or web address] [facility]  [phone]  CC: Primary care physician; Hortencia Pilar, MD

## 2017-01-15 NOTE — Care Management Obs Status (Signed)
[facility] NOTIFICATION   Patient Details  Name: [name] MRN: [id number] Date of Birth: Jun 30, 1941   Medicare Observation Status Notification Given:  Yes    Katrina Stack, RN 01/15/2017, 11:30 AM

## 2017-01-15 NOTE — Consult Note (Signed)
[facility]  CARDIOLOGY CONSULT NOTE  Patient ID: [name] MRN: [id number] DOB/AGE: December 07, 1940 76 y.o.  Admit date: 01/14/2017 Referring PhysicianDr. Leslye Peer Primary Physician Colbert Ewing, PA Primary Cardiologist Dr. Nehemiah Massed Reason for Consultation afib  HPI: Patient is a 76 year old female with history of paroxysmal atrial fibrillation as well as a history of coronary artery disease and diabetes. She felt recurrent palpitations yesterday. She was sent to the ER by her primary care physician's office for probable recurrent A. fib. On presentation to the emergency room electrocardiogram appeared to show atrial fibrillation with controlled ventricular response. She was anticoagulated. At some point she was felt to have A-V dissociation. She was admitted for this. On telemetry overnight she had atrial fibrillation with conversion back to sinus rhythm. There was no significant A-V dissociation noted. INR was 1.85. Her serum potassium was 3.4. Review of telemetry revealed atrial fibrillation with controlled ventricular response. Now currently is in sinus rhythm. She had some abdominal pain which has improved. Review of Systems  Constitutional: Negative.   HENT: Negative.   Eyes: Negative.   Respiratory: Negative.   Cardiovascular: Positive for palpitations.  Gastrointestinal: Positive for abdominal pain.  Genitourinary: Negative.   Musculoskeletal: Negative.   Skin: Negative.   Neurological: Negative.   Endo/Heme/Allergies: Negative.   Psychiatric/Behavioral: Negative.     Past Medical History:  Diagnosis Date  . A-fib ([place])   . Bradycardia   . Coronary artery disease   . Diabetes mellitus without complication ([place])   . Diverticulitis   . Pulmonary embolism ([facility])     Family History  Problem Relation Age of Onset  . Congestive Heart Failure Mother   . CVA Mother   . COPD Father     Social History   Social History  .  Marital status: Widowed    Spouse name: N/A  . Number of children: N/A  . Years of education: N/A   Occupational History  . Not on file.   Social History Main Topics  . Smoking status: Never Smoker  . Smokeless tobacco: Never Used  . Alcohol use No  . Drug use: Unknown  . Sexual activity: Not on file   Other Topics Concern  . Not on file   Social History Narrative  . No narrative on file    Past Surgical History:  Procedure Laterality Date  . ABDOMINAL HYSTERECTOMY    . BREAST BIOPSY Right    bx x2-neg  . CARDIAC SURGERY    . CHOLECYSTECTOMY    . EYE SURGERY       Prescriptions Prior to Admission  Medication Sig Dispense Refill Last Dose  . amLODipine (NORVASC) 2.5 MG tablet Take 2.5 mg by mouth daily.   09/02/2016 at Unknown time  . aspirin EC 81 MG tablet Take 81 mg by mouth at bedtime.   01/13/2017 at Unknown time  . Co-Enzyme Q10 200 MG CAPS Take 1 capsule by mouth daily.     . metoprolol tartrate (LOPRESSOR) 25 MG tablet Take 25 mg by mouth 2 (two) times daily.   09/02/2016 at Unknown time  . Multiple Vitamins-Minerals (CENTRUM SILVER PO) Take 1 tablet by mouth daily.   09/02/2016 at Unknown time  . Nutritional Supplements (SALMON OIL) CAPS Take 1 capsule by mouth daily.     . pravastatin (PRAVACHOL) 40 MG tablet Take 40 mg by mouth at bedtime.   01/13/2017 at Unknown time  . quinapril (ACCUPRIL) 40 MG tablet Take 40  mg by mouth daily.   09/02/2016 at Unknown time  . warfarin (COUMADIN) 4 MG tablet 4 mg every evening 30 tablet 0 01/13/2017 at Unknown time  . Magnesium Oxide 400 (240 Mg) MG TABS Take 2 tablets by mouth daily as needed (muscle cramping). 60 tablet 0 prn at prn  . meloxicam (MOBIC) 7.5 MG tablet Take 1 tablet (7.5 mg total) by mouth daily. Do not take w/motrin alleve or excedrin 30 tablet 0   . potassium chloride (K-DUR) 10 MEQ tablet One tablet daily for three days (Patient not taking: Reported on 01/14/2017) 3 tablet 0 Not Taking at Unknown time     Physical Exam: Blood pressure 120/61, pulse 78, temperature 98.6 F (37 C), temperature source Oral, resp. rate 18, height 5\' 1"  (1.549 m), weight 69.7 kg (153 lb 10.6 oz), SpO2 94 %.   Wt Readings from Last 1 Encounters:  01/15/17 69.7 kg (153 lb 10.6 oz)     General appearance: alert and cooperative Head: Normocephalic, without obvious abnormality, atraumatic Resp: clear to auscultation bilaterally Cardio: regular rate and rhythm GI: soft, non-tender; bowel sounds normal; no masses,  no organomegaly Extremities: extremities normal, atraumatic, no cyanosis or edema  Labs:   Lab Results  Component Value Date   WBC 6.1 01/15/2017   HGB 13.6 01/15/2017   HCT 39.8 01/15/2017   MCV 90.9 01/15/2017   PLT 214 01/15/2017    Recent Labs Lab 01/15/17 0438  NA 142  K 3.4*  CL 106  CO2 26  BUN 16  CREATININE 0.79  CALCIUM 8.6*  GLUCOSE 116*   Lab Results  Component Value Date   CKTOTAL 99 01/12/2014   CKMB 1.1 01/12/2014   TROPONINI <0.03 01/15/2017      Radiology: No acute cardiopulmonary process EKG: Atrial fibrillation with variable ventricular response currently in sinus rhythm.  ASSESSMENT AND PLAN:  Patient with history of atrial fibrillation anticoagulated with warfarin who had palpitations was sent to the ER. EKG revealed intermittent atrial fibrillation with an atrial tachycardia. She has converted to sinus rhythm. She has ruled out for myocardial infarction. There was concern over AV dissociation. There was no evidence of AV disassociation. She currently is asymptomatic without any further discomfort in her abdomen. Her INR was 1.8. INR goal between 2 and 3. No further cardiac workup during this hospitalization indicated. Would discharge on current medications with follow-up per Dr. Nehemiah Massed. Signed: Teodoro Spray MD, [facility] 01/15/2017, 7:27 AM

## 2017-01-16 LAB — HEMOGLOBIN A1C
Hgb A1c MFr Bld: 6.7 % — ABNORMAL HIGH (ref 4.8–5.6)
Mean Plasma Glucose: 146 mg/dL

## 2017-02-01 ENCOUNTER — Emergency Department
Admission: EM | Admit: 2017-02-01 | Discharge: 2017-02-01 | Disposition: A | Discharge status: Home / Self Care | Payer: Medicare Other | Attending: Emergency Medicine | Admitting: Emergency Medicine

## 2017-02-01 DIAGNOSIS — H6690 Otitis media, unspecified, unspecified ear: Secondary | ICD-10-CM

## 2017-02-01 DIAGNOSIS — R42 Dizziness and giddiness: Secondary | ICD-10-CM | POA: Insufficient documentation

## 2017-02-01 DIAGNOSIS — I2510 Atherosclerotic heart disease of native coronary artery without angina pectoris: Secondary | ICD-10-CM | POA: Insufficient documentation

## 2017-02-01 DIAGNOSIS — I10 Essential (primary) hypertension: Secondary | ICD-10-CM | POA: Insufficient documentation

## 2017-02-01 DIAGNOSIS — E119 Type 2 diabetes mellitus without complications: Secondary | ICD-10-CM | POA: Insufficient documentation

## 2017-02-01 DIAGNOSIS — H938X3 Other specified disorders of ear, bilateral: Secondary | ICD-10-CM | POA: Diagnosis present

## 2017-02-01 DIAGNOSIS — Z7901 Long term (current) use of anticoagulants: Secondary | ICD-10-CM | POA: Diagnosis not present

## 2017-02-01 DIAGNOSIS — H6693 Otitis media, unspecified, bilateral: Secondary | ICD-10-CM | POA: Insufficient documentation

## 2017-02-01 DIAGNOSIS — Z79899 Other long term (current) drug therapy: Secondary | ICD-10-CM | POA: Insufficient documentation

## 2017-02-01 DIAGNOSIS — Z7982 Long term (current) use of aspirin: Secondary | ICD-10-CM | POA: Diagnosis not present

## 2017-02-01 HISTORY — DX: Essential (primary) hypertension: I10

## 2017-02-01 MED ORDER — AMOXICILLIN 500 MG PO CAPS
1000.0000 mg | ORAL_CAPSULE | Freq: Once | ORAL | Status: AC
  Administered 2017-02-01: 1000 mg via ORAL
  Filled 2017-02-01: qty 2

## 2017-02-01 MED ORDER — AMOXICILLIN 500 MG PO TABS: 1000.0000 mg | ORAL_TABLET | Freq: Two times a day (BID) | ORAL | 0 refills | Status: AC

## 2017-02-01 MED ORDER — ONDANSETRON HCL 4 MG/2ML IJ SOLN
4.0000 mg | Freq: Once | INTRAMUSCULAR | Status: AC
  Administered 2017-02-01: 4 mg via INTRAVENOUS
  Filled 2017-02-01: qty 2

## 2017-02-01 MED ORDER — MECLIZINE HCL 25 MG PO TABS
25.0000 mg | ORAL_TABLET | Freq: Once | ORAL | Status: AC
  Administered 2017-02-01: 25 mg via ORAL
  Filled 2017-02-01: qty 1

## 2017-02-01 MED ORDER — MECLIZINE HCL 32 MG PO TABS: 32.0000 mg | ORAL_TABLET | Freq: Three times a day (TID) | ORAL | 0 refills | Status: DC | PRN

## 2017-02-01 NOTE — ED Notes (Signed)
Pt. Going home with family. 

## 2017-02-01 NOTE — ED Triage Notes (Signed)
Pt to ED via [facility] c/o nausea. EMS reports pt stated a sudden onset of dizziness, nausea, and vomiting, and administered 4mg  of Zofran. Pt alert and oriented, denies pain; in no acute distress at this time.

## 2017-02-01 NOTE — ED Provider Notes (Signed)
[facility] Emergency Department Provider Note  ____________________________________________   First MD Initiated Contact with Patient 02/01/17 1550     (approximate)  I have reviewed the triage vital signs and the nursing notes.   HISTORY  Chief Complaint Nausea    HPI [name] is a 76 y.o. female who comes to the emergency department via EMS for room spinning vertigo for the past hour associated with nausea and vomiting. She denies headache. She denies double vision or blurred vision. She denies numbness or weakness. She has had a URI for the past several days along with some left greater than right ear fullness and discomfort.Zofran helped her nausea. Moving her head makes the symptoms worse.   Past Medical History:  Diagnosis Date  . A-fib ([place])   . Bradycardia   . Coronary artery disease   . Diabetes mellitus without complication ([place])   . Diverticulitis   . Hypertension   . Pulmonary embolism [facility])     Patient Active Problem List   Diagnosis Date Noted  . 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 Epigastric abdominal pain 01/14/2017  . Malignant essential hypertension 01/14/2017  . Diabetes mellitus ([place]) 01/14/2017  . CVA (cerebral infarction) 06/02/2015    Past Surgical History:  Procedure Laterality Date  . ABDOMINAL HYSTERECTOMY    . BREAST BIOPSY Right    bx x2-neg  . CARDIAC SURGERY    . CHOLECYSTECTOMY    . EYE SURGERY      Prior to Admission medications   Medication Sig Start Date End Date Taking? Authorizing Provider  aspirin EC 81 MG tablet Take 81 mg by mouth at bedtime.   Yes [provider]  Co-Enzyme Q10 200 MG CAPS Take 1 capsule by mouth daily.   Yes [provider]  diltiazem (CARDIZEM CD) 120 MG 24 hr capsule Take 1 capsule (120 mg total) by mouth daily. 01/15/17  Yes Loletha Grayer, MD  Magnesium Oxide 400 (240 Mg) MG TABS One tablet daily 01/15/17  Yes Wieting, Richard, MD  Multiple  Vitamins-Minerals (CENTRUM SILVER PO) Take 1 tablet by mouth daily.   Yes [provider]  Nutritional Supplements (SALMON OIL) CAPS Take 1 capsule by mouth daily.   Yes [provider]  pantoprazole (PROTONIX) 40 MG tablet Take 1 tablet (40 mg total) by mouth daily. 01/16/17  Yes Wieting, Richard, MD  pravastatin (PRAVACHOL) 40 MG tablet Take 40 mg by mouth at bedtime.   Yes [provider]  quinapril (ACCUPRIL) 40 MG tablet Take 40 mg by mouth daily.   Yes [provider]  warfarin (COUMADIN) 3 MG tablet Take 3 mg by mouth on Sunday and Thursday. Take 4 mg tablet on Monday, Tuesday, Wednesday, Friday and Saturday. 12/10/16  Yes [provider]  warfarin (COUMADIN) 4 MG tablet 4 mg every evening Patient taking differently: Take 3-4 mg by mouth every evening. Take 4 mg by mouth on Monday, Tuesday, Wednesday, Friday and Saturday. Take 3 mg by mouth on Sunday and Thursday. 06/03/15  Yes Wieting, Richard, MD  amoxicillin (AMOXIL) 500 MG tablet Take 2 tablets (1,000 mg total) by mouth 2 (two) times daily. 02/01/17 02/08/17  Darel Hong, MD  meclizine (ANTIVERT) 32 MG tablet Take 1 tablet (32 mg total) by mouth 3 (three) times daily as needed. 02/01/17   Darel Hong, MD  potassium chloride (K-DUR) 10 MEQ tablet One tablet daily for five Patient not taking: Reported on 02/01/2017 01/15/17   Loletha Grayer, MD  Allergies Statins and Vecuronium  Family History  Problem Relation Age of Onset  . Congestive Heart Failure Mother   . CVA Mother   . COPD Father     Social History Social History  Substance Use Topics  . Smoking status: Never Smoker  . Smokeless tobacco: Never Used  . Alcohol use No    Review of Systems Constitutional: No fever/chills Eyes: No visual changes. ENT: No sore throat. Cardiovascular: Denies chest pain. Respiratory: Denies shortness of breath. Gastrointestinal: No abdominal pain.  Positive nausea, positive vomiting.  No  diarrhea.  No constipation. Genitourinary: Negative for dysuria. Musculoskeletal: Negative for back pain. Skin: Negative for rash. Neurological: Negative for headaches, focal weakness or numbness.   ____________________________________________   PHYSICAL EXAM:  VITAL SIGNS: ED Triage Vitals  Enc Vitals Group     BP      Pulse      Resp      Temp      Temp src      SpO2      Weight      Height      Head Circumference      Peak Flow      Pain Score      Pain Loc      Pain Edu?      Excl. in [place]?     Constitutional: Alert and oriented x 4 appears uncomfortable retching and vomiting Eyes: PERRL EOMI. lateral nystagmus fatigable right greater than left  Head: Atraumatic. Nose: No congestion/rhinnorhea. Mouth/Throat: Right tympanic membrane bulging and erythematous left tympanic membrane is sclerotic Neck: No stridor.   Cardiovascular: Normal rate, regular rhythm. Grossly normal heart sounds.  Good peripheral circulation. Respiratory: Normal respiratory effort.  No retractions. Lungs CTAB and moving good air Gastrointestinal: Soft nontender Musculoskeletal: No lower extremity edema   Neurologic:  Normal speech and language. No gross focal neurologic deficits are appreciated. Normal finger-nose-finger no dysdiadochokinesis no pronator drift 5 out of 5 throughout Skin:  Skin is warm, dry and intact. No rash noted. Psychiatric: Mood and affect are normal. Speech and behavior are normal.    ____________________________________________   DIFFERENTIAL  Central vertigo, peripheral vertigo ____________________________________________   LABS (all labs ordered are listed, but only abnormal results are displayed)  Labs Reviewed - No data to display   __________________________________________  EKG   ____________________________________________  RADIOLOGY   ____________________________________________   PROCEDURES  Procedure(s) performed:  no  Procedures  Critical Care performed: no  Observation: no ____________________________________________   INITIAL IMPRESSION / ASSESSMENT AND PLAN / ED COURSE  Pertinent labs & imaging results that were available during my care of the patient were reviewed by me and considered in my medical decision making (see chart for details).  The patient arrives very uncomfortable appearing with clear vertigo that is most consistent with peripheral. Doubt central etiology given intermittent symptoms and no other neurological findings. She does have bulging tympanic membranes bilaterally and while her symptoms are somewhat consistent with BPPV and may also be related to her upper respiratory tract infection and subsequent acute otitis media. She feels better after meclizine and a first dose of amoxicillin so I will treat her symptomatically and with antibiotics and refer her to otolaryngology outpatient. Strict return precautions given.      ____________________________________________   FINAL CLINICAL IMPRESSION(S) / ED DIAGNOSES  Final diagnoses:  Vertigo  Acute otitis media, unspecified otitis media type      NEW MEDICATIONS STARTED DURING THIS VISIT:  New Prescriptions  AMOXICILLIN (AMOXIL) 500 MG TABLET    Take 2 tablets (1,000 mg total) by mouth 2 (two) times daily.   MECLIZINE (ANTIVERT) 32 MG TABLET    Take 1 tablet (32 mg total) by mouth 3 (three) times daily as needed.     Note:  This document was prepared using Dragon voice recognition software and may include unintentional dictation errors.     Darel Hong, MD 02/01/17 [phone]

## 2017-02-01 NOTE — Discharge Instructions (Addendum)
Please take all of your antibiotics as prescribed and follow up with otolaryngology in 1 week if your symptoms persist. Return to the emergency department sooner for any concerns.  It was a pleasure to take care of you today, and thank you for coming to our emergency department.  If you have any questions or concerns before leaving please ask the nurse to grab me and I'm more than happy to go through your aftercare instructions again.  If you were prescribed any opioid pain medication today such as Norco, Vicodin, Percocet, morphine, hydrocodone, or oxycodone please make sure you do not drive when you are taking this medication as it can alter your ability to drive safely.  If you have any concerns once you are home that you are not improving or are in fact getting worse before you can make it to your follow-up appointment, please do not hesitate to call 911 and come back for further evaluation.  Darel Hong MD

## 2017-02-27 ENCOUNTER — Other Ambulatory Visit: Payer: Self-pay | Admitting: Family Medicine

## 2017-02-27 DIAGNOSIS — Z1231 Encounter for screening mammogram for malignant neoplasm of breast: Secondary | ICD-10-CM

## 2017-03-05 ENCOUNTER — Ambulatory Visit: Payer: Medicare Other

## 2017-03-06 ENCOUNTER — Ambulatory Visit
Admission: RE | Admit: 2017-03-06 | Discharge: 2017-03-06 | Disposition: A | Discharge status: Home / Self Care | Payer: Medicare Other | Source: Ambulatory Visit | Attending: Family Medicine | Admitting: Family Medicine

## 2017-03-06 DIAGNOSIS — Z1231 Encounter for screening mammogram for malignant neoplasm of breast: Secondary | ICD-10-CM | POA: Diagnosis present

## 2017-05-24 ENCOUNTER — Other Ambulatory Visit: Payer: Self-pay | Admitting: Otolaryngology

## 2017-05-24 DIAGNOSIS — J340 Abscess, furuncle and carbuncle of nose: Secondary | ICD-10-CM

## 2017-05-24 DIAGNOSIS — D385 Neoplasm of uncertain behavior of other respiratory organs: Secondary | ICD-10-CM

## 2017-05-29 ENCOUNTER — Ambulatory Visit
Admission: RE | Admit: 2017-05-29 | Discharge: 2017-05-29 | Disposition: A | Discharge status: Home / Self Care | Payer: Medicare Other | Source: Ambulatory Visit | Attending: Otolaryngology | Admitting: Otolaryngology

## 2017-05-29 DIAGNOSIS — D385 Neoplasm of uncertain behavior of other respiratory organs: Secondary | ICD-10-CM

## 2017-05-29 MED ORDER — IOPAMIDOL (ISOVUE-300) INJECTION 61%
75.0000 mL | Freq: Once | INTRAVENOUS | Status: AC | PRN
  Administered 2017-05-29: 75 mL via INTRAVENOUS

## 2017-08-06 ENCOUNTER — Inpatient Hospital Stay: Admission: RE | Admit: 2017-08-06 | Payer: Medicare Other | Source: Ambulatory Visit

## 2017-08-13 ENCOUNTER — Other Ambulatory Visit: Payer: Self-pay

## 2017-08-13 ENCOUNTER — Encounter
Admission: RE | Admit: 2017-08-13 | Discharge: 2017-08-13 | Disposition: A | Discharge status: Home / Self Care | Payer: Medicare Other | Source: Ambulatory Visit | Attending: Otolaryngology | Admitting: Otolaryngology

## 2017-08-13 DIAGNOSIS — I10 Essential (primary) hypertension: Secondary | ICD-10-CM | POA: Insufficient documentation

## 2017-08-13 DIAGNOSIS — E119 Type 2 diabetes mellitus without complications: Secondary | ICD-10-CM | POA: Diagnosis not present

## 2017-08-13 DIAGNOSIS — I4589 Other specified conduction disorders: Secondary | ICD-10-CM | POA: Diagnosis not present

## 2017-08-13 DIAGNOSIS — Z01812 Encounter for preprocedural laboratory examination: Secondary | ICD-10-CM | POA: Diagnosis not present

## 2017-08-13 DIAGNOSIS — Z8673 Personal history of transient ischemic attack (TIA), and cerebral infarction without residual deficits: Secondary | ICD-10-CM | POA: Insufficient documentation

## 2017-08-13 DIAGNOSIS — R1013 Epigastric pain: Secondary | ICD-10-CM | POA: Diagnosis not present

## 2017-08-13 DIAGNOSIS — R1310 Dysphagia, unspecified: Secondary | ICD-10-CM | POA: Diagnosis not present

## 2017-08-13 HISTORY — DX: Other complications of anesthesia, initial encounter: T88.59XA

## 2017-08-13 HISTORY — DX: Adverse effect of unspecified anesthetic, initial encounter: T41.45XA

## 2017-08-13 HISTORY — DX: Hyperlipidemia, unspecified: E78.5

## 2017-08-13 LAB — BASIC METABOLIC PANEL
Anion gap: 11 (ref 5–15)
BUN: 17 mg/dL (ref 6–20)
CALCIUM: 9.1 mg/dL (ref 8.9–10.3)
CO2: 26 mmol/L (ref 22–32)
CREATININE: 0.79 mg/dL (ref 0.44–1.00)
Chloride: 103 mmol/L (ref 101–111)
GFR calc Af Amer: >60 mL/min (ref >60)
GLUCOSE: 148 mg/dL — AB (ref 65–99)
Potassium: 3.6 mmol/L (ref 3.5–5.1)
Sodium: 140 mmol/L (ref 135–145)

## 2017-08-13 LAB — CBC
HCT: 41.8 % (ref 35.0–47.0)
Hemoglobin: 14 g/dL (ref 12.0–16.0)
MCH: 31.5 pg (ref 26.0–34.0)
MCHC: 33.6 g/dL (ref 32.0–36.0)
MCV: 93.7 fL (ref 80.0–100.0)
PLATELETS: 210 10*3/uL (ref 150–440)
RBC: 4.46 MIL/uL (ref 3.80–5.20)
RDW: 13.6 % (ref 11.5–14.5)
WBC: 6 10*3/uL (ref 3.6–11.0)

## 2017-08-13 NOTE — Patient Instructions (Signed)
Your procedure is scheduled on: Monday 08/19/17 Report to [facility]. 2ND FLOOR MEDICAL MALL ENTRANCE. To find out your arrival time please call [phone] between 1PM - 3PM on Friday 08/16/17.  Remember: Instructions that are not followed completely may result in serious medical risk, up to and including death, or upon the discretion of your surgeon and anesthesiologist your surgery may need to be rescheduled.    __X__ 1. Do not eat anything after midnight the night before your    procedure.  No gum chewing or hard candies.  You may drink clear   liquids up to 2 hours before you are scheduled to arrive at the   hospital for your procedure. Do not drink clear liquids within 2   hours of scheduled arrival to the hospital as this may lead to your   procedure being delayed or rescheduled.       Clear liquids include:   Water or Apple juice without pulp   Clear carbohydrate beverage such as Clearfast or Gatorade   Black coffee or Clear Tea (no milk, no creamer, do not add anything   to the coffee or tea)    Diabetics should only drink water   __X__ 2. No Alcohol for 24 hours before or after surgery.   ____ 3. Bring all medications with you on the day of surgery if instructed.    __X__ 4. Notify your doctor if there is any change in your medical condition     (cold, fever, infections).             __X___5. No smoking within 24 hours of your surgery.     Do not wear jewelry, make-up, hairpins, clips or nail polish.  Do not wear lotions, powders, or perfumes.   Do not shave 48 hours prior to surgery. Men may shave face and neck.  Do not bring valuables to the hospital.    [facility] is not responsible for any belongings or valuables.               Contacts, dentures or bridgework may not be worn into surgery.  Leave your suitcase in the car. After surgery it may be brought to your room.  For patients admitted to the hospital, discharge time is determined by your                treatment  team.   Patients discharged the day of surgery will not be allowed to drive home.   Please read over the following fact sheets that you were given:   MRSA Information   __X__ Take these medicines the morning of surgery with A SIP OF WATER:    1. PANTOPRAZOLE AT BEDTIME AND THE MORNING OF SURGERY  2.   3.   4.  5.  6.  ____ Fleet Enema (as directed)   ____ Use CHG Soap/SAGE wipes as directed  ____ Use inhalers on the day of surgery  ____ Stop metformin 2 days prior to surgery    ____ Take 1/2 of usual insulin dose the night before surgery and none on the morning of surgery.   __X__ Stop Coumadin/Plavix/aspirin on AS INSTRUCTED BY DR Nehemiah Massed  __X__ Stop Anti-inflammatories such as Advil, Aleve, Ibuprofen, Motrin, Naproxen, Naprosyn, Goodies,powder, or aspirin products.  OK to take Tylenol.   __X__ Stop supplements, Vitamin E, Fish Oil until after surgery.  CO Q 10, SALMON OIL  ____ Bring C-Pap to the hospital.

## 2017-08-18 MED ORDER — CEFAZOLIN SODIUM-DEXTROSE 1-4 GM/50ML-% IV SOLN
1.0000 g | Freq: Once | INTRAVENOUS | Status: AC
  Administered 2017-08-19: 1 g via INTRAVENOUS

## 2017-08-19 ENCOUNTER — Ambulatory Visit: Payer: Medicare Other | Admitting: Certified Registered"

## 2017-08-19 ENCOUNTER — Encounter
Admission: RE | Disposition: A | Discharge status: Home / Self Care | Payer: Self-pay | Source: Ambulatory Visit | Attending: Otolaryngology

## 2017-08-19 ENCOUNTER — Encounter: Payer: Self-pay | Admitting: *Deleted

## 2017-08-19 ENCOUNTER — Ambulatory Visit
Admission: RE | Admit: 2017-08-19 | Discharge: 2017-08-19 | Disposition: A | Discharge status: Home / Self Care | Payer: Medicare Other | Source: Ambulatory Visit | Attending: Otolaryngology | Admitting: Otolaryngology

## 2017-08-19 ENCOUNTER — Other Ambulatory Visit: Payer: Self-pay

## 2017-08-19 DIAGNOSIS — I4891 Unspecified atrial fibrillation: Secondary | ICD-10-CM | POA: Insufficient documentation

## 2017-08-19 DIAGNOSIS — I2510 Atherosclerotic heart disease of native coronary artery without angina pectoris: Secondary | ICD-10-CM | POA: Insufficient documentation

## 2017-08-19 DIAGNOSIS — E7800 Pure hypercholesterolemia, unspecified: Secondary | ICD-10-CM | POA: Insufficient documentation

## 2017-08-19 DIAGNOSIS — Z79899 Other long term (current) drug therapy: Secondary | ICD-10-CM | POA: Diagnosis not present

## 2017-08-19 DIAGNOSIS — Z86711 Personal history of pulmonary embolism: Secondary | ICD-10-CM | POA: Insufficient documentation

## 2017-08-19 DIAGNOSIS — Z90710 Acquired absence of both cervix and uterus: Secondary | ICD-10-CM | POA: Diagnosis not present

## 2017-08-19 DIAGNOSIS — Z7901 Long term (current) use of anticoagulants: Secondary | ICD-10-CM | POA: Diagnosis not present

## 2017-08-19 DIAGNOSIS — D1809 Hemangioma of other sites: Secondary | ICD-10-CM | POA: Insufficient documentation

## 2017-08-19 DIAGNOSIS — K130 Diseases of lips: Secondary | ICD-10-CM | POA: Insufficient documentation

## 2017-08-19 DIAGNOSIS — I10 Essential (primary) hypertension: Secondary | ICD-10-CM | POA: Insufficient documentation

## 2017-08-19 DIAGNOSIS — Z86718 Personal history of other venous thrombosis and embolism: Secondary | ICD-10-CM | POA: Diagnosis not present

## 2017-08-19 DIAGNOSIS — E119 Type 2 diabetes mellitus without complications: Secondary | ICD-10-CM | POA: Diagnosis not present

## 2017-08-19 DIAGNOSIS — J341 Cyst and mucocele of nose and nasal sinus: Secondary | ICD-10-CM | POA: Diagnosis present

## 2017-08-19 DIAGNOSIS — Z7982 Long term (current) use of aspirin: Secondary | ICD-10-CM | POA: Insufficient documentation

## 2017-08-19 DIAGNOSIS — Z951 Presence of aortocoronary bypass graft: Secondary | ICD-10-CM | POA: Insufficient documentation

## 2017-08-19 HISTORY — PX: EXCISION NASAL MASS: SHX6271

## 2017-08-19 LAB — GLUCOSE, CAPILLARY
GLUCOSE-CAPILLARY: 162 mg/dL — AB (ref 65–99)
Glucose-Capillary: 139 mg/dL — ABNORMAL HIGH (ref 65–99)

## 2017-08-19 SURGERY — EXCISION, MASS, NOSE
Anesthesia: General | Laterality: Left

## 2017-08-19 MED ORDER — LIDOCAINE-EPINEPHRINE (PF) 1 %-1:200000 IJ SOLN
INTRAMUSCULAR | Status: DC | PRN
  Administered 2017-08-19: 6 mL

## 2017-08-19 MED ORDER — MIDAZOLAM HCL 2 MG/2ML IJ SOLN
INTRAMUSCULAR | Status: DC | PRN
  Administered 2017-08-19: 2 mg via INTRAVENOUS

## 2017-08-19 MED ORDER — FENTANYL CITRATE (PF) 100 MCG/2ML IJ SOLN
INTRAMUSCULAR | Status: DC | PRN
  Administered 2017-08-19 (×2): 50 ug via INTRAVENOUS

## 2017-08-19 MED ORDER — FENTANYL CITRATE (PF) 100 MCG/2ML IJ SOLN
INTRAMUSCULAR | Status: AC
  Filled 2017-08-19: qty 2

## 2017-08-19 MED ORDER — HYDROCODONE-ACETAMINOPHEN 5-325 MG PO TABS
ORAL_TABLET | ORAL | Status: AC
  Administered 2017-08-19: 1 via ORAL
  Filled 2017-08-19: qty 1

## 2017-08-19 MED ORDER — LIDOCAINE-EPINEPHRINE (PF) 1 %-1:200000 IJ SOLN
INTRAMUSCULAR | Status: AC
  Filled 2017-08-19: qty 30

## 2017-08-19 MED ORDER — PROPOFOL 10 MG/ML IV BOLUS
INTRAVENOUS | Status: DC | PRN
  Administered 2017-08-19: 150 mg via INTRAVENOUS

## 2017-08-19 MED ORDER — EPHEDRINE SULFATE 50 MG/ML IJ SOLN
INTRAMUSCULAR | Status: AC
  Filled 2017-08-19: qty 1

## 2017-08-19 MED ORDER — ONDANSETRON HCL 4 MG/2ML IJ SOLN
INTRAMUSCULAR | Status: DC | PRN
  Administered 2017-08-19: 4 mg via INTRAVENOUS

## 2017-08-19 MED ORDER — BACITRACIN ZINC 500 UNIT/GM EX OINT
TOPICAL_OINTMENT | CUTANEOUS | Status: AC
  Filled 2017-08-19: qty 28.35

## 2017-08-19 MED ORDER — LIDOCAINE HCL (PF) 2 % IJ SOLN
INTRAMUSCULAR | Status: AC
  Filled 2017-08-19: qty 10

## 2017-08-19 MED ORDER — SODIUM CHLORIDE 0.9 % IV SOLN
INTRAVENOUS | Status: DC
  Administered 2017-08-19 (×2): via INTRAVENOUS

## 2017-08-19 MED ORDER — MIDAZOLAM HCL 2 MG/2ML IJ SOLN
INTRAMUSCULAR | Status: AC
  Filled 2017-08-19: qty 2

## 2017-08-19 MED ORDER — FENTANYL CITRATE (PF) 100 MCG/2ML IJ SOLN: 25.0000 ug | INTRAMUSCULAR | Status: DC | PRN

## 2017-08-19 MED ORDER — PROPOFOL 10 MG/ML IV BOLUS
INTRAVENOUS | Status: AC
  Filled 2017-08-19: qty 40

## 2017-08-19 MED ORDER — LIDOCAINE HCL (CARDIAC) 20 MG/ML IV SOLN
INTRAVENOUS | Status: DC | PRN
  Administered 2017-08-19: 100 mg via INTRAVENOUS

## 2017-08-19 MED ORDER — SUCCINYLCHOLINE CHLORIDE 20 MG/ML IJ SOLN
INTRAMUSCULAR | Status: DC | PRN
  Administered 2017-08-19: 100 mg via INTRAVENOUS

## 2017-08-19 MED ORDER — EPHEDRINE SULFATE 50 MG/ML IJ SOLN
INTRAMUSCULAR | Status: DC | PRN
Start: 2017-08-19 — End: 2017-08-19
  Administered 2017-08-19: 15 mg via INTRAVENOUS

## 2017-08-19 MED ORDER — CEFAZOLIN SODIUM-DEXTROSE 1-4 GM/50ML-% IV SOLN
INTRAVENOUS | Status: AC
Start: 2017-08-19 — End: 2017-08-19
  Filled 2017-08-19: qty 50

## 2017-08-19 MED ORDER — GLYCOPYRROLATE 0.2 MG/ML IJ SOLN
INTRAMUSCULAR | Status: DC | PRN
  Administered 2017-08-19: 0.2 mg via INTRAVENOUS

## 2017-08-19 MED ORDER — HYDROCODONE-ACETAMINOPHEN 5-325 MG PO TABS
1.0000 | ORAL_TABLET | ORAL | Status: DC | PRN
  Administered 2017-08-19: 1 via ORAL

## 2017-08-19 MED ORDER — PHENYLEPHRINE HCL 10 % OP SOLN
Freq: Once | OPHTHALMIC | Status: AC
  Administered 2017-08-19: 10 mL via TOPICAL
  Filled 2017-08-19: qty 10

## 2017-08-19 MED ORDER — ONDANSETRON HCL 4 MG/2ML IJ SOLN: 4.0000 mg | Freq: Once | INTRAMUSCULAR | Status: DC | PRN

## 2017-08-19 MED ORDER — GLYCOPYRROLATE 0.2 MG/ML IJ SOLN
INTRAMUSCULAR | Status: AC
  Filled 2017-08-19: qty 1

## 2017-08-19 MED ORDER — ONDANSETRON HCL 4 MG/2ML IJ SOLN
INTRAMUSCULAR | Status: AC
  Filled 2017-08-19: qty 2

## 2017-08-19 SURGICAL SUPPLY — 36 items
BLADE SURG 15 STRL LF DISP TIS (BLADE) ×1 IMPLANT
BLADE SURG 15 STRL SS (BLADE) ×1
CNTNR SPEC 2.5X3XGRAD LEK (MISCELLANEOUS) ×1
CONT SPEC 4OZ STER OR WHT (MISCELLANEOUS) ×1
CONTAINER SPEC 2.5X3XGRAD LEK (MISCELLANEOUS) ×1 IMPLANT
CORD BIP STRL DISP 12FT (MISCELLANEOUS) ×2 IMPLANT
DRAPE MAG INST 16X20 L/F (DRAPES) ×2 IMPLANT
DRSG TEGADERM 2-3/8X2-3/4 SM (GAUZE/BANDAGES/DRESSINGS) ×2 IMPLANT
DRSG TEGADERM 4X4.75 (GAUZE/BANDAGES/DRESSINGS) ×2 IMPLANT
DRSG TELFA 4X3 1S NADH ST (GAUZE/BANDAGES/DRESSINGS) ×2 IMPLANT
ELECT CAUTERY BLADE TIP 2.5 (TIP) ×2
ELECT NEEDLE 20X.3 GREEN (MISCELLANEOUS) ×2
ELECT REM PT RETURN 9FT ADLT (ELECTROSURGICAL) ×2
ELECTRODE CAUTERY BLDE TIP 2.5 (TIP) ×1 IMPLANT
ELECTRODE NEEDLE 20X.3 GREEN (MISCELLANEOUS) ×1 IMPLANT
ELECTRODE REM PT RTRN 9FT ADLT (ELECTROSURGICAL) ×1 IMPLANT
GLOVE BIO SURGEON STRL SZ7.5 (GLOVE) ×2 IMPLANT
GLOVE PROTEXIS LATEX SZ 7.5 (GLOVE) ×2 IMPLANT
GOWN STRL REUS W/ TWL LRG LVL3 (GOWN DISPOSABLE) ×2 IMPLANT
GOWN STRL REUS W/TWL LRG LVL3 (GOWN DISPOSABLE) ×2
LABEL OR SOLS (LABEL) ×2 IMPLANT
NS IRRIG 500ML POUR BTL (IV SOLUTION) ×2 IMPLANT
PACK HEAD/NECK (MISCELLANEOUS) ×2 IMPLANT
PROBE MONO 100X0.75 ELECT 1.9M (MISCELLANEOUS) ×2 IMPLANT
PROBE NEUROSIGN BIPOL (MISCELLANEOUS) ×1 IMPLANT
PROBE NEUROSIGN BIPOLAR (MISCELLANEOUS) ×1
SHEARS HARMONIC 9CM CVD (BLADE) ×2 IMPLANT
SPONGE KITTNER 5P (MISCELLANEOUS) ×2 IMPLANT
SPONGE XRAY 4X4 16PLY STRL (MISCELLANEOUS) ×2 IMPLANT
SUT ETHILON 5-0 FS-2 18 BLK (SUTURE) ×2 IMPLANT
SUT ETHILON 6 0 9-3 1X18 BLK (SUTURE) ×2 IMPLANT
SUT SILK 2 0 (SUTURE) ×1
SUT SILK 2-0 18XBRD TIE 12 (SUTURE) ×1 IMPLANT
SUT SILK 3 0 REEL (SUTURE) ×2 IMPLANT
SUT VIC AB 4-0 PS2 18 (SUTURE) ×2 IMPLANT
SUT VIC AB 4-0 RB1 18 (SUTURE) ×2 IMPLANT

## 2017-08-19 NOTE — Anesthesia Post-op Follow-up Note (Signed)
Anesthesia QCDR form completed.        

## 2017-08-19 NOTE — H&P (Signed)
H&P has been reviewedand patient reevaluated,  and no changes necessary. To be downloaded later.  

## 2017-08-19 NOTE — Anesthesia Preprocedure Evaluation (Signed)
Anesthesia Evaluation  Patient identified by MRN, date of birth, ID band Patient awake    Reviewed: Allergy & Precautions, H&P , NPO status , Patient's Chart, lab work & pertinent test results, reviewed documented beta blocker date and time   History of Anesthesia Complications (+) history of anesthetic complications  Airway Mallampati: II  TM Distance: >3 FB Neck ROM: full    Dental  (+) Dental Advidsory Given, Caps, Teeth Intact   Pulmonary neg shortness of breath, neg sleep apnea, neg COPD, Recent URI , Residual Cough,           Cardiovascular Exercise Tolerance: Good hypertension, (-) angina+ CAD and + CABG  (-) Past MI and (-) Cardiac Stents + dysrhythmias Atrial Fibrillation (-) Valvular Problems/Murmurs     Neuro/Psych negative neurological ROS  negative psych ROS   GI/Hepatic Neg liver ROS, GERD  ,  Endo/Other  diabetes  Renal/GU negative Renal ROS  negative genitourinary   Musculoskeletal   Abdominal   Peds  Hematology negative hematology ROS (+)   Anesthesia Other Findings Past Medical History: No date: A-fib (HCC) No date: Bradycardia No date: Complication of anesthesia     Comment:  vecuronium = rash (during cabg) No date: Coronary artery disease No date: Diabetes mellitus without complication (HCC)     Comment:  pt states no DM No date: Diverticulitis No date: HLD (hyperlipidemia) No date: Hypertension No date: Pulmonary embolism (HCC)   Reproductive/Obstetrics negative OB ROS                             Anesthesia Physical Anesthesia Plan  ASA: III  Anesthesia Plan: General   Post-op Pain Management:    Induction: Intravenous  PONV Risk Score and Plan: 3 and Ondansetron and Dexamethasone  Airway Management Planned: Oral ETT  Additional Equipment:   Intra-op Plan:   Post-operative Plan: Extubation in OR  Informed Consent: I have reviewed the patients  History and Physical, chart, labs and discussed the procedure including the risks, benefits and alternatives for the proposed anesthesia with the patient or authorized representative who has indicated his/her understanding and acceptance.   Dental Advisory Given  Plan Discussed with: Anesthesiologist, CRNA and Surgeon  Anesthesia Plan Comments:         Anesthesia Quick Evaluation

## 2017-08-19 NOTE — Op Note (Signed)
08/19/2017  8:29 AM    [name]  [id number]   Pre-Op Dx:  Cyst of the floor the nose and upper lip  Post-op Dx: Same  Proc: Excision of cyst from the floor of the nose and upper lip through a degloving incision under the lip   Surg:  Margaretha Sheffield H    Assist: Beverly Gust  Anes:  GOT  EBL:  50 mL  Comp:  None  Findings:  This was a fluid-filled sac that had an indentation in the upper maxilla at the floor the nose. It was smooth indentation and not attached here. It spread into the floor the nose push the mucosa upward. It was pushing up the lateral nasal alar and skin of the upper lip.  Procedure: The patient was brought to the operating suite and placed in a supine position. She was given general anesthesia by oral endotracheal intubation. The nose was prepped with cottonoid pledgets soaked in phenylephrine and Xylocaine. The upper lip was elevated and 1% Xylocaine with epi 1:100,000 was used for infiltration around the cyst. This was infiltrated into the anterior septum, and underneath the nasal alar. She was prepped and draped sterile fashion.  The lip was elevated mucosa was incised about the quarter-inch away from the superior sulcus. The incision was carried through mucosa and some thin muscle layer. The cyst was clearly evident and had a dark color to it were it was a bloody fluid inside. It had a pretty dense capsule around it. The overlying tissues were freed up from the capsule inferiorly and deep to it. A freer was used to separated from the underlying bone. It had a nice dissection plane between the capsule and the underlying tissues. It was loosened on its lateral border some both sides. As we came underneath it and we found where it was attached the mucosa the floor of the nose. This separated nicely from the mucosa and the floor of the nose mucosa was left intact. Remaining attachments were cut across with bipolar cautery and this was used to help control any  bleeding. The cyst was sent to pathology for permanent section. There been a slight leak and some of the fluid to come out but much of it was still left inside the sac. There are couple of separate bumps that had been at the floor the nose that still had fluid inside them.  The wound was irrigated and any further small bleeding sites were controlled. It was felt she did not need a drained here. A nasal speculum was used to look inside the nose and the floor the nose was intact and settle down into the divot of the maxillary bone where the cyst had been sitting. The inferior nasal spine was intact along the septum intact. The mucosal wound was then closed with a 3-0 chromic in a running locking suture. This was used to close mucosa and the fine muscle layer underneath it. There is no bleeding or swelling at all. This came out as well as can be expected with no complications.  Patient was awakened and taken to the recovery room in satisfactory condition. There were no operative complications.  Dispo:   To PACU to be discharged home  Plan:  To follow-up in the office in 1 week to make sure she is doing well. She will wait 3 more days before starting her Coumadin. She should not have a lot of discomfort but more irritation underneath her lip from the incision line. We use pain  medication as needed and rinse mouth with water after eating.  Virgilio Broadhead H  08/19/2017 8:29 AM

## 2017-08-19 NOTE — Transfer of Care (Signed)
Immediate Anesthesia Transfer of Care Note  Patient: [name]  Procedure(s) Performed: EXCISION NASAL LESION - LEFT - EXTERNAL APPROACH (Left )  Patient Location: PACU  Anesthesia Type:General  Level of Consciousness: drowsy and patient cooperative  Airway & Oxygen Therapy: Patient Spontanous Breathing and Patient connected to face mask oxygen  Post-op Assessment: Report given to RN and Post -op Vital signs reviewed and stable  Post vital signs: Reviewed and stable  Last Vitals:  Vitals:   08/19/17 0611  BP: (!) 178/78  Pulse: 67  Resp: 18  Temp: (!) 36.3 C  SpO2: 98%    Last Pain:  Vitals:   08/19/17 0611  TempSrc: Oral         Complications: No apparent anesthesia complications

## 2017-08-19 NOTE — Progress Notes (Signed)
Slight drainage from left nares after trip to bathroom

## 2017-08-19 NOTE — OR Nursing (Signed)
Last PT/INR is from one 07/26/17. Patient had last dose of warfarin on last Thursday. Dr. Kathyrn Sheriff aware and reports no new lab needed today.

## 2017-08-19 NOTE — Anesthesia Procedure Notes (Addendum)
Procedure Name: Intubation Date/Time: 08/19/2017 7:27 AM Performed by: Silvana Newness, CRNA Pre-anesthesia Checklist: Patient identified, Emergency Drugs available, Suction available, Patient being monitored and Timeout performed Patient Re-evaluated:Patient Re-evaluated prior to induction Oxygen Delivery Method: Circle system utilized Preoxygenation: Pre-oxygenation with 100% oxygen Induction Type: IV induction Ventilation: Mask ventilation without difficulty Laryngoscope Size: Mac and 3 Grade View: Grade I Tube type: Oral Rae Tube size: 7.0 mm Number of attempts: 1 Airway Equipment and Method: Stylet Placement Confirmation: ETT inserted through vocal cords under direct vision,  positive ETCO2 and breath sounds checked- equal and bilateral Tube secured with: Tape Dental Injury: Teeth and Oropharynx as per pre-operative assessment

## 2017-08-20 ENCOUNTER — Encounter: Payer: Self-pay | Admitting: Otolaryngology

## 2017-08-20 NOTE — Anesthesia Postprocedure Evaluation (Signed)
Anesthesia Post Note  Patient: [name]  Procedure(s) Performed: EXCISION NASAL LESION - LEFT - EXTERNAL APPROACH (Left )  Patient location during evaluation: PACU Anesthesia Type: General Level of consciousness: awake and alert Pain management: pain level controlled Vital Signs Assessment: post-procedure vital signs reviewed and stable Respiratory status: spontaneous breathing, nonlabored ventilation, respiratory function stable and patient connected to nasal cannula oxygen Cardiovascular status: blood pressure returned to baseline and stable Postop Assessment: no apparent nausea or vomiting Anesthetic complications: no     Last Vitals:  Vitals:   08/19/17 0909 08/19/17 0937  BP: 136/73 (!) 154/68  Pulse: 75 66  Resp: 16   Temp: (!) 36.3 C   SpO2: 92% 93%    Last Pain:  Vitals:   08/19/17 1006  TempSrc:   PainSc: 2                  Martha Clan

## 2017-08-21 LAB — SURGICAL PATHOLOGY

## 2018-04-29 ENCOUNTER — Other Ambulatory Visit: Payer: Self-pay | Admitting: Family Medicine

## 2018-04-29 DIAGNOSIS — Z780 Asymptomatic menopausal state: Secondary | ICD-10-CM

## 2018-06-10 ENCOUNTER — Other Ambulatory Visit: Payer: Self-pay | Admitting: Family Medicine

## 2018-06-10 DIAGNOSIS — Z1231 Encounter for screening mammogram for malignant neoplasm of breast: Secondary | ICD-10-CM

## 2018-06-17 ENCOUNTER — Ambulatory Visit
Admission: RE | Admit: 2018-06-17 | Discharge: 2018-06-17 | Disposition: A | Discharge status: Home / Self Care | Payer: Medicare Other | Source: Ambulatory Visit | Attending: Family Medicine | Admitting: Family Medicine

## 2018-06-17 ENCOUNTER — Encounter (INDEPENDENT_AMBULATORY_CARE_PROVIDER_SITE_OTHER): Payer: Self-pay

## 2018-06-17 DIAGNOSIS — Z1231 Encounter for screening mammogram for malignant neoplasm of breast: Secondary | ICD-10-CM | POA: Diagnosis present

## 2018-06-17 DIAGNOSIS — Z780 Asymptomatic menopausal state: Secondary | ICD-10-CM

## 2019-06-02 ENCOUNTER — Other Ambulatory Visit: Payer: Self-pay | Admitting: Family Medicine

## 2019-06-02 DIAGNOSIS — Z1231 Encounter for screening mammogram for malignant neoplasm of breast: Secondary | ICD-10-CM

## 2019-06-22 ENCOUNTER — Ambulatory Visit
Admission: RE | Admit: 2019-06-22 | Discharge: 2019-06-22 | Disposition: A | Discharge status: Home / Self Care | Payer: Medicare Other | Source: Ambulatory Visit | Attending: Family Medicine | Admitting: Family Medicine

## 2019-06-22 ENCOUNTER — Other Ambulatory Visit: Payer: Self-pay

## 2019-06-22 DIAGNOSIS — Z1231 Encounter for screening mammogram for malignant neoplasm of breast: Secondary | ICD-10-CM | POA: Diagnosis present

## 2019-06-23 ENCOUNTER — Other Ambulatory Visit: Payer: Self-pay | Admitting: Family Medicine

## 2019-06-23 DIAGNOSIS — R928 Other abnormal and inconclusive findings on diagnostic imaging of breast: Secondary | ICD-10-CM

## 2019-06-23 DIAGNOSIS — N6320 Unspecified lump in the left breast, unspecified quadrant: Secondary | ICD-10-CM

## 2019-07-03 ENCOUNTER — Ambulatory Visit
Admission: RE | Admit: 2019-07-03 | Discharge: 2019-07-03 | Disposition: A | Discharge status: Home / Self Care | Payer: Medicare Other | Source: Ambulatory Visit | Attending: Family Medicine | Admitting: Family Medicine

## 2019-07-03 DIAGNOSIS — N6320 Unspecified lump in the left breast, unspecified quadrant: Secondary | ICD-10-CM | POA: Diagnosis present

## 2019-07-03 DIAGNOSIS — R928 Other abnormal and inconclusive findings on diagnostic imaging of breast: Secondary | ICD-10-CM

## 2019-07-07 ENCOUNTER — Other Ambulatory Visit: Payer: Self-pay | Admitting: Family Medicine

## 2019-07-07 DIAGNOSIS — R928 Other abnormal and inconclusive findings on diagnostic imaging of breast: Secondary | ICD-10-CM

## 2019-07-07 DIAGNOSIS — N6320 Unspecified lump in the left breast, unspecified quadrant: Secondary | ICD-10-CM

## 2019-09-18 DIAGNOSIS — D242 Benign neoplasm of left breast: Secondary | ICD-10-CM

## 2019-09-18 HISTORY — DX: Benign neoplasm of left breast: D24.2

## 2020-01-04 ENCOUNTER — Ambulatory Visit
Admission: RE | Admit: 2020-01-04 | Discharge: 2020-01-04 | Disposition: A | Discharge status: Home / Self Care | Payer: Medicare Other | Source: Ambulatory Visit | Attending: Family Medicine | Admitting: Family Medicine

## 2020-01-04 DIAGNOSIS — R928 Other abnormal and inconclusive findings on diagnostic imaging of breast: Secondary | ICD-10-CM | POA: Insufficient documentation

## 2020-01-04 DIAGNOSIS — N6320 Unspecified lump in the left breast, unspecified quadrant: Secondary | ICD-10-CM | POA: Insufficient documentation

## 2020-01-05 ENCOUNTER — Other Ambulatory Visit: Payer: Self-pay | Admitting: Family Medicine

## 2020-01-05 DIAGNOSIS — R928 Other abnormal and inconclusive findings on diagnostic imaging of breast: Secondary | ICD-10-CM

## 2020-01-05 DIAGNOSIS — N6320 Unspecified lump in the left breast, unspecified quadrant: Secondary | ICD-10-CM

## 2020-01-11 ENCOUNTER — Ambulatory Visit
Admission: RE | Admit: 2020-01-11 | Discharge: 2020-01-11 | Disposition: A | Discharge status: Home / Self Care | Payer: Medicare Other | Source: Ambulatory Visit | Attending: Family Medicine | Admitting: Family Medicine

## 2020-01-11 DIAGNOSIS — N6320 Unspecified lump in the left breast, unspecified quadrant: Secondary | ICD-10-CM

## 2020-01-11 DIAGNOSIS — R928 Other abnormal and inconclusive findings on diagnostic imaging of breast: Secondary | ICD-10-CM | POA: Diagnosis not present

## 2020-01-11 HISTORY — PX: BREAST BIOPSY: SHX20

## 2020-01-12 LAB — SURGICAL PATHOLOGY

## 2020-01-18 ENCOUNTER — Telehealth: Payer: Self-pay

## 2020-01-18 NOTE — Telephone Encounter (Signed)
Patient has an appt with Dr. Peyton Najjar on Jan 21, 2020 at 10:30.  Patient aware of appt time/date.

## 2020-01-21 ENCOUNTER — Ambulatory Visit: Payer: Self-pay | Admitting: General Surgery

## 2020-01-21 ENCOUNTER — Other Ambulatory Visit: Payer: Self-pay | Admitting: General Surgery

## 2020-01-21 DIAGNOSIS — D242 Benign neoplasm of left breast: Secondary | ICD-10-CM

## 2020-01-21 NOTE — H&P (Signed)
PATIENT PROFILE: [name] is a 79 y.o. female who presents to the Clinic for consultation at the request of Dr. Hoy Morn for evaluation of left breast papilloma.  PCP:  Dimas Chyle, MD  HISTORY OF PRESENT ILLNESS: Ms. Bigler reports reports she had her usual yearly mammogram in October 2020.  At that time a small round mass was identified on the left breast 5 o'clock position.  This was read as probably benign and mammogram in 6 months was recommended for close follow-up.  Due to the change in the shape of the mass and ultrasound-guided biopsy was recommended.  Core biopsy shows findings concerning of intraductal papilloma.  Surgical referral was recommended for excision.  Patient denies any palpable mass, skin changes, nipple retraction or discharge.  Family history of breast cancer: None Family history of other cancers: None Menarche: 79 years old Menopause: Hysterectomy in her 76s Used OCP: None Used estrogen and progesterone therapy: Yes History of Radiation to the chest: No Age of first pregnancy: 79 years old Number of pregnancies: 2 Previous breast biopsy: 2 on the right breast (benign)  PROBLEM LIST:        Problem List  Date Reviewed: 08/04/2019       Noted   Allergic rhinitis 06/15/2019   Bradycardia 11/13/2016   Microscopic hematuria 08/26/2015   FH: colon polyps 12/01/2014   Mixed hyperlipidemia 07/28/2014   Paroxysmal A-fib (CMS-HCC) 07/06/2014   Moderate mitral insufficiency 07/06/2014   Type 2 diabetes mellitus, controlled (CMS-HCC) 03/13/2012   CAD (coronary artery disease) 09/04/2011   Overview    08/10/2011 3 vessel CABG- LIMA to LAD, SVG to obtuse marginal and RCA  Followed per Dr. Nehemiah Massed at [facility]      History of pulmonary embolism 09/04/2011   Overview    2012      Long term (current) use of anticoagulants 09/04/2011   Essential hypertension, benign 07/16/2011      GENERAL  REVIEW OF SYSTEMS:   General ROS: negative for - chills, fatigue, fever, weight gain or weight loss Allergy and Immunology ROS: negative for - hives  Hematological and Lymphatic ROS: negative for - bleeding problems or bruising, negative for palpable nodes Endocrine ROS: negative for - heat or cold intolerance, hair changes Respiratory ROS: negative for - cough, shortness of breath or wheezing Cardiovascular ROS: no chest pain or palpitations GI ROS: negative for nausea, vomiting, abdominal pain, diarrhea, constipation Musculoskeletal ROS: negative for - joint swelling or muscle pain Neurological ROS: negative for - confusion, syncope Dermatological ROS: negative for pruritus and rash Psychiatric: negative for anxiety, depression, difficulty sleeping and memory loss  MEDICATIONS: Current Medications        Current Outpatient Medications  Medication Sig Dispense Refill  . blood glucose diagnostic test strip Check fasting blood sugar daily E11.9 100 each 12  . blood glucose meter (ONETOUCH ULTRA2 METER) kit by XX route as directed 1 each 0  . carvediloL (COREG) 3.125 MG tablet Take 1 tablet (3.125 mg total) by mouth 2 (two) times daily with meals 60 tablet 11  . cetirizine (ZYRTEC) 10 mg capsule Take 1 capsule (10 mg total) by mouth once daily 30 capsule 11  . co-enzyme Q-10, ubiquinone, 200 mg capsule Take 200 mg by mouth once daily.    Marland Kitchen diltiazem (CARDIZEM CD) 120 MG XR capsule TAKE 1 CAPSULE BY MOUTH EVERY DAY 90 capsule 1  . lancets Check fasting blood sugar daily E11.9 100 each 12  . magnesium 200 mg  Take by mouth once daily    . multivit-mineral-iron-lutein Tab 1 tab by mouth daily    . pantoprazole (PROTONIX) 40 MG DR tablet Take 1 tablet (40 mg total) by mouth once daily 90 tablet 11  . pravastatin (PRAVACHOL) 40 MG tablet Take 1 tablet (40 mg total) by mouth once daily 90 tablet 3  . quinapriL (ACCUPRIL) 40 MG tablet Take 1 tablet (40 mg total) by mouth once daily 90  tablet 0  . SALMON OIL/OMEGA-3 FATTY ACIDS (SALMON OIL-1000 ORAL) Take by mouth.    . spironolactone (ALDACTONE) 50 MG tablet TAKE 1 TABLET(50 MG) BY MOUTH EVERY DAY 90 tablet 1  . warfarin (COUMADIN) 3 MG tablet Take 1 tablet (3 mg total) by mouth once daily 90 tablet 11  . warfarin (COUMADIN) 4 MG tablet Take 1 tablet (4 mg total) by mouth once daily 90 tablet 3   No current facility-administered medications for this visit.      ALLERGIES: Statins-hmg-coa reductase inhibitors; Vecuronium bromide; and Asa buff (mag carb-al glyc) [aspirin,buff(mag crb-aluminum)]  PAST MEDICAL HISTORY:     Past Medical History:  Diagnosis Date  . Atrial fibrillation (CMS-HCC)   . CAD (coronary artery disease) 09/04/2011   3 vessel CABG  . FH: colon polyps 12/01/2014  . History of pulmonary embolism 09/04/2011  . Hypercholesteremia   . Hyperglycemia 03/13/2012  . Hyperlipidemia   . Hypertension   . Obesity 07/16/2011  . Venous thrombosis   . VHD (valvular heart disease)     PAST SURGICAL HISTORY:      Past Surgical History:  Procedure Laterality Date  . CHOLECYSTECTOMY    . COLOGUARD  02/01/2015   Negative: Do Hemoccult Cards in 3 yrs per RTE: CBF 01/2018; Recall Ltr mailed 12/18/2017 (dh)  . COLONOSCOPY  11/13/2000   Int Hemorrhoids: CBF 10/2010  . CORONARY ARTERY BYPASS GRAFT  07/2011   x 3-[facility]  . EGD  11/13/2000  . EXTRACTION CATARACT EXTRACAPSULAR W/INSERTION INTRAOCULAR PROSTHESIS Left 03/22/2014   Procedure: EXTRACTION CATARACT EXTRACAPSULAR W/INSERTION INTRAOCULAR PROSTHESIS;  Surgeon: Ross Ludwig, MD;  Location: [facility];  Service: Ophthalmology;  Laterality: Left;  RESTOR  . EXTRACTION CATARACT EXTRACAPSULAR W/INSERTION INTRAOCULAR PROSTHESIS Right 04/05/2014   Procedure: EXTRACTION CATARACT EXTRACAPSULAR W/INSERTION INTRAOCULAR PROSTHESIS;  Surgeon: Ross Ludwig, MD;  Location: [facility];  Service: Ophthalmology;  Laterality: Right;  .  HYSTERECTOMY       FAMILY HISTORY:      Family History  Problem Relation Age of Onset  . Coronary Artery Disease (Blocked arteries around heart) Mother   . Heart failure Mother   . Stroke Mother   . Coronary Artery Disease (Blocked arteries around heart) Father   . Colon polyps Father   . Myocardial Infarction (Heart attack) Father   . Coronary Artery Disease (Blocked arteries around heart) Sister   . Pacemaker Sister   . No Known Problems Daughter   . No Known Problems Son   . No Known Problems Sister      SOCIAL HISTORY: Social History          Socioeconomic History  . Marital status: Widowed    Spouse name: Not on file  . Number of children: Not on file  . Years of education: Not on file  . Highest education level: Not on file  Occupational History  . Not on file  Tobacco Use  . Smoking status: Never Smoker  . Smokeless tobacco: Never Used  Vaping Use  . Vaping Use:  Never used  Substance and Sexual Activity  . Alcohol use: No    Alcohol/week: 0.0 standard drinks  . Drug use: No  . Sexual activity: Not Currently  Other Topics Concern  . Not on file  Social History Narrative   Widow, husband died in 02/15/2013. Married x 54 years.  2 kids, 2 grandkids.  Used to work at [employer].  Wears seatbelt, sunscreen.  Exercise: she plans to start walking again soon.  Dentist: yes.  Calcium in diet: yes.  No relgious beliefs affecting health care.  She is Ep[employer]opalian.       Has Living Will - her children has copy.   Social Determinants of Health      Financial Resource Strain:   . Difficulty of Paying Living Expenses:   Food Insecurity:   . Worried About Charity fundraiser in the Last Year:   . Arboriculturist in the Last Year:   Transportation Needs:   . Film/video editor (Medical):   Marland Kitchen Lack of Transportation (Non-Medical):       PHYSICAL EXAM:    Vitals:   01/21/20 1027  BP: 161/83  Pulse: 55   Body mass index  is 32.32 kg/m. Weight: 72.6 kg (160 lb)   GENERAL: Alert, active, oriented x3  HEENT: Pupils equal reactive to light. Extraocular movements are intact. Sclera clear. Palpebral conjunctiva normal red color.Pharynx clear.  NECK: Supple with no palpable mass and no adenopathy.  LUNGS: Sound clear with no rales rhonchi or wheezes.  HEART: Regular rhythm S1 and S2 without murmur.  BREAST: breasts appear normal, no suspicious masses, no skin or nipple changes or axillary nodes.  ABDOMEN: Soft and depressible, nontender with no palpable mass, no hepatomegaly.  EXTREMITIES: Well-developed well-nourished symmetrical with no dependent edema.  NEUROLOGICAL: Awake alert oriented, facial expression symmetrical, moving all extremities.  REVIEW OF DATA: I have reviewed the following data today: Anti-coag visit on 12/30/2019  Component Date Value  . Prothrombin Time, Whole * 12/30/2019 30.0*  . Prothrombin INR WB 12/30/2019 2.5*  Anti-coag visit on 12/02/2019  Component Date Value  . Prothrombin Time, Whole * 12/02/2019 35.8*  . Prothrombin INR WB 12/02/2019 3.0*  Anti-coag visit on 11/18/2019  Component Date Value  . Prothrombin Time, Whole * 11/18/2019 33.3*  . Prothrombin INR WB 11/18/2019 2.8*  Anti-coag visit on 11/11/2019  Component Date Value  . Prothrombin Time, Whole * 11/11/2019 45.8*  . Prothrombin INR WB 11/11/2019 3.8*  . Prothrombin Time, Whole * 11/11/2019 47.5*  . Prothrombin INR WB 11/11/2019 4.0*     ASSESSMENT: Ms. Kutsch is a 79 y.o. female presenting for consultation for left breast intraductal papilloma.    Patient was oriented again about the pathology results. Surgical alternatives were discussed with patient including excisional biopsy.  Patient was oriented that the purpose of the biopsy is to rule out a malignancy and to decrease the risk of this mass converting to cancer in the future.  Surgical technique and post operative care was discussed  with patient. Risk of surgery was discussed with patient including but not limited to: wound infection, seroma, hematoma, brachial plexopathy, mondor's disease (thrombosis of small veins of breast), chronic wound pain, breast lymphedema, altered sensation to the nipple and cosmesis among others.   Discussed with patient about the slightly higher risk of bleeding due to her chronic use of Coumadin.  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  Left breast radiofrequency tag guided excisional biopsy 2.  We will contact your cardiologist for recommendation of your Coumadin management before the surgery and cardiac clearance. 3. Contact us if has any concern.   Patient and her daughter verbalized understanding, all questions were answered, and were agreeable with the plan outlined above.     Herbert Pun, MD  Electronically signed by Herbert Pun, MD

## 2020-01-25 ENCOUNTER — Ambulatory Visit
Admission: RE | Admit: 2020-01-25 | Discharge: 2020-01-25 | Disposition: A | Discharge status: Home / Self Care | Payer: Medicare Other | Source: Ambulatory Visit | Attending: General Surgery | Admitting: General Surgery

## 2020-01-25 DIAGNOSIS — D242 Benign neoplasm of left breast: Secondary | ICD-10-CM

## 2020-01-27 ENCOUNTER — Inpatient Hospital Stay: Admission: RE | Admit: 2020-01-27 | Payer: Medicare Other | Source: Ambulatory Visit

## 2020-01-28 ENCOUNTER — Other Ambulatory Visit: Payer: Medicare Other

## 2020-02-01 ENCOUNTER — Ambulatory Visit
Admission: RE | Admit: 2020-02-01 | Discharge: 2020-02-01 | Disposition: A | Discharge status: Home / Self Care | Payer: Medicare Other | Source: Ambulatory Visit | Attending: General Surgery | Admitting: General Surgery

## 2020-02-01 ENCOUNTER — Ambulatory Visit: Admission: RE | Admit: 2020-02-01 | Payer: Medicare Other | Source: Home / Self Care | Admitting: General Surgery

## 2020-02-01 DIAGNOSIS — D242 Benign neoplasm of left breast: Secondary | ICD-10-CM

## 2020-03-15 ENCOUNTER — Other Ambulatory Visit: Payer: Self-pay | Admitting: General Surgery

## 2020-03-15 DIAGNOSIS — D242 Benign neoplasm of left breast: Secondary | ICD-10-CM

## 2020-03-24 ENCOUNTER — Other Ambulatory Visit
Admission: RE | Admit: 2020-03-24 | Discharge: 2020-03-24 | Disposition: A | Discharge status: Home / Self Care | Payer: Medicare Other | Source: Ambulatory Visit | Attending: General Surgery | Admitting: General Surgery

## 2020-03-24 ENCOUNTER — Other Ambulatory Visit: Payer: Self-pay

## 2020-03-24 HISTORY — DX: Chronic kidney disease, unspecified: N18.9

## 2020-03-24 HISTORY — DX: Nonrheumatic mitral (valve) insufficiency: I34.0

## 2020-03-24 HISTORY — DX: Gastro-esophageal reflux disease without esophagitis: K21.9

## 2020-03-24 NOTE — Patient Instructions (Addendum)
COVID TESTING Date: Monday, July 12 Testing site:  [facility] [address] Thru Hours:  9:02 am - 1:00 pm Once you are tested, you are asked to stay quarantined (avoiding public places) until after your surgery.  Your procedure is scheduled on: Wednesday, July 14 Report to Day Surgery on the 2nd floor of the [facility]. To find out your arrival time, please call [phone] between 1PM - 3PM on: Tuesday, July 13  REMEMBER: Instructions that are not followed completely may result in serious medical risk, up to and including death; or upon the discretion of your surgeon and anesthesiologist your surgery may need to be rescheduled.  Do not eat food after midnight the night before surgery.  No gum chewing, lozengers or hard candies.  You may however, drink water up to 2 hours before you are scheduled to arrive for your surgery. Do not drink anything within 2 hours of your scheduled arrival time.  TAKE THESE MEDICATIONS THE MORNING OF SURGERY WITH A SIP OF WATER:  1.  Carvedilol 2.  Pantoprazole (protonix) - (take one the night before and one on the morning of surgery - helps to prevent nausea after surgery.)  Follow recommendations from Cardiologist, Pulmonologist or PCP regarding stopping Coumadin. Per cardiologist,,stop coumadin 5 days prior to surgery.  Stop Anti-inflammatories (NSAIDS) such as Advil, Aleve, Ibuprofen, Motrin, Naproxen, Naprosyn and Aspirin based products such as Excedrin, Goodys Powder, BC Powder. (May take Tylenol or Acetaminophen if needed.)  Stop ANY OVER THE COUNTER supplements until after surgery.  On the morning of surgery brush your teeth with toothpaste and water, you may rinse your mouth with mouthwash if you wish. Do not swallow any toothpaste or mouthwash.  Do not wear jewelry, make-up, hairpins, clips or nail polish.  Do not wear lotions, powders, or perfumes.   Do not shave 48 hours prior to surgery.   Contact  lenses, hearing aids and dentures may not be worn into surgery.  Do not bring valuables to the hospital. [facility] is not responsible for any missing/lost belongings or valuables.   Use CHG Soap as directed on instruction sheet.  Notify your doctor if there is any change in your medical condition (cold, fever, infection).  Wear comfortable clothing (specific to your surgery type) to the hospital.  Plan for stool softeners for home use; pain medications have a tendency to cause constipation. You can also help prevent constipation by eating foods high in fiber such as fruits and vegetables and drinking plenty of fluids as your diet allows.  After surgery, you can help prevent lung complications by doing breathing exercises.  Take deep breaths and cough every 1-2 hours. Your doctor may order a device called an Incentive Spirometer to help you take deep breaths.  If you are being discharged the day of surgery, you will not be allowed to drive home. You will need a responsible adult (18 years or older) to drive you home and stay with you that night.   If you are taking public transportation, you will need to have a responsible adult (18 years or older) with you. Please confirm with your physician that it is acceptable to use public transportation.   Please call the [facility] at [phone] if you have any questions about these instructions.  Visitation Policy:  Patients undergoing a surgery or procedure may have one family member or support person with them as long as that person is not COVID-19 positive or experiencing  its symptoms.  That person may remain in the waiting area during the procedure.  As a reminder, masks are still required for all [place] team members, patients and visitors in all [place] facilities.   Systemwide, no visitors 17 or younger.

## 2020-03-28 ENCOUNTER — Other Ambulatory Visit: Payer: Self-pay

## 2020-03-28 ENCOUNTER — Other Ambulatory Visit
Admission: RE | Admit: 2020-03-28 | Discharge: 2020-03-28 | Disposition: A | Discharge status: Home / Self Care | Payer: Medicare Other | Source: Ambulatory Visit | Attending: General Surgery | Admitting: General Surgery

## 2020-03-28 DIAGNOSIS — Z20822 Contact with and (suspected) exposure to covid-19: Secondary | ICD-10-CM | POA: Insufficient documentation

## 2020-03-28 DIAGNOSIS — Z01812 Encounter for preprocedural laboratory examination: Secondary | ICD-10-CM | POA: Diagnosis present

## 2020-03-28 LAB — SARS CORONAVIRUS 2 (TAT 6-24 HRS): SARS Coronavirus 2: NEGATIVE

## 2020-03-29 MED ORDER — CEFAZOLIN SODIUM-DEXTROSE 2-4 GM/100ML-% IV SOLN
2.0000 g | INTRAVENOUS | Status: AC
  Administered 2020-03-30: 2 g via INTRAVENOUS

## 2020-03-30 ENCOUNTER — Ambulatory Visit: Payer: Medicare Other | Admitting: Anesthesiology

## 2020-03-30 ENCOUNTER — Ambulatory Visit
Admission: RE | Admit: 2020-03-30 | Discharge: 2020-03-30 | Disposition: A | Discharge status: Home / Self Care | Payer: Medicare Other | Source: Ambulatory Visit | Attending: General Surgery | Admitting: General Surgery

## 2020-03-30 ENCOUNTER — Other Ambulatory Visit: Payer: Self-pay

## 2020-03-30 ENCOUNTER — Encounter: Payer: Self-pay | Admitting: General Surgery

## 2020-03-30 ENCOUNTER — Encounter: Admission: RE | Payer: Self-pay | Source: Home / Self Care

## 2020-03-30 ENCOUNTER — Encounter
Admission: RE | Disposition: A | Discharge status: Home / Self Care | Payer: Self-pay | Source: Home / Self Care | Attending: General Surgery

## 2020-03-30 ENCOUNTER — Ambulatory Visit
Admission: RE | Admit: 2020-03-30 | Discharge: 2020-03-30 | Disposition: A | Discharge status: Home / Self Care | Payer: Medicare Other | Attending: General Surgery | Admitting: General Surgery

## 2020-03-30 DIAGNOSIS — Z9842 Cataract extraction status, left eye: Secondary | ICD-10-CM | POA: Diagnosis not present

## 2020-03-30 DIAGNOSIS — Z961 Presence of intraocular lens: Secondary | ICD-10-CM | POA: Diagnosis not present

## 2020-03-30 DIAGNOSIS — Z8349 Family history of other endocrine, nutritional and metabolic diseases: Secondary | ICD-10-CM | POA: Insufficient documentation

## 2020-03-30 DIAGNOSIS — Z888 Allergy status to other drugs, medicaments and biological substances status: Secondary | ICD-10-CM | POA: Insufficient documentation

## 2020-03-30 DIAGNOSIS — Z86711 Personal history of pulmonary embolism: Secondary | ICD-10-CM | POA: Insufficient documentation

## 2020-03-30 DIAGNOSIS — Z823 Family history of stroke: Secondary | ICD-10-CM | POA: Insufficient documentation

## 2020-03-30 DIAGNOSIS — I2510 Atherosclerotic heart disease of native coronary artery without angina pectoris: Secondary | ICD-10-CM | POA: Insufficient documentation

## 2020-03-30 DIAGNOSIS — Z833 Family history of diabetes mellitus: Secondary | ICD-10-CM | POA: Insufficient documentation

## 2020-03-30 DIAGNOSIS — Z8249 Family history of ischemic heart disease and other diseases of the circulatory system: Secondary | ICD-10-CM | POA: Insufficient documentation

## 2020-03-30 DIAGNOSIS — Z79899 Other long term (current) drug therapy: Secondary | ICD-10-CM | POA: Diagnosis not present

## 2020-03-30 DIAGNOSIS — I129 Hypertensive chronic kidney disease with stage 1 through stage 4 chronic kidney disease, or unspecified chronic kidney disease: Secondary | ICD-10-CM | POA: Diagnosis not present

## 2020-03-30 DIAGNOSIS — Z9841 Cataract extraction status, right eye: Secondary | ICD-10-CM | POA: Insufficient documentation

## 2020-03-30 DIAGNOSIS — Z9049 Acquired absence of other specified parts of digestive tract: Secondary | ICD-10-CM | POA: Diagnosis not present

## 2020-03-30 DIAGNOSIS — K219 Gastro-esophageal reflux disease without esophagitis: Secondary | ICD-10-CM | POA: Insufficient documentation

## 2020-03-30 DIAGNOSIS — Z886 Allergy status to analgesic agent status: Secondary | ICD-10-CM | POA: Insufficient documentation

## 2020-03-30 DIAGNOSIS — Z951 Presence of aortocoronary bypass graft: Secondary | ICD-10-CM | POA: Insufficient documentation

## 2020-03-30 DIAGNOSIS — Z90710 Acquired absence of both cervix and uterus: Secondary | ICD-10-CM | POA: Insufficient documentation

## 2020-03-30 DIAGNOSIS — E669 Obesity, unspecified: Secondary | ICD-10-CM | POA: Insufficient documentation

## 2020-03-30 DIAGNOSIS — I340 Nonrheumatic mitral (valve) insufficiency: Secondary | ICD-10-CM | POA: Diagnosis not present

## 2020-03-30 DIAGNOSIS — N62 Hypertrophy of breast: Secondary | ICD-10-CM | POA: Insufficient documentation

## 2020-03-30 DIAGNOSIS — N6320 Unspecified lump in the left breast, unspecified quadrant: Secondary | ICD-10-CM | POA: Diagnosis present

## 2020-03-30 DIAGNOSIS — N183 Chronic kidney disease, stage 3 unspecified: Secondary | ICD-10-CM | POA: Insufficient documentation

## 2020-03-30 DIAGNOSIS — Z6831 Body mass index (BMI) 31.0-31.9, adult: Secondary | ICD-10-CM | POA: Diagnosis not present

## 2020-03-30 DIAGNOSIS — Z8371 Family history of colonic polyps: Secondary | ICD-10-CM | POA: Insufficient documentation

## 2020-03-30 DIAGNOSIS — R001 Bradycardia, unspecified: Secondary | ICD-10-CM | POA: Insufficient documentation

## 2020-03-30 DIAGNOSIS — E785 Hyperlipidemia, unspecified: Secondary | ICD-10-CM | POA: Insufficient documentation

## 2020-03-30 DIAGNOSIS — Z7901 Long term (current) use of anticoagulants: Secondary | ICD-10-CM | POA: Insufficient documentation

## 2020-03-30 DIAGNOSIS — I480 Paroxysmal atrial fibrillation: Secondary | ICD-10-CM | POA: Insufficient documentation

## 2020-03-30 DIAGNOSIS — D242 Benign neoplasm of left breast: Secondary | ICD-10-CM | POA: Insufficient documentation

## 2020-03-30 DIAGNOSIS — E1122 Type 2 diabetes mellitus with diabetic chronic kidney disease: Secondary | ICD-10-CM | POA: Insufficient documentation

## 2020-03-30 HISTORY — PX: BREAST EXCISIONAL BIOPSY: SUR124

## 2020-03-30 HISTORY — PX: BREAST LUMPECTOMY WITH RADIO FREQUENCY LOCALIZER: SHX6897

## 2020-03-30 LAB — GLUCOSE, CAPILLARY
Glucose-Capillary: 134 mg/dL — ABNORMAL HIGH (ref 70–99)
Glucose-Capillary: 122 mg/dL — ABNORMAL HIGH (ref 70–99)

## 2020-03-30 LAB — PROTIME-INR
INR: 1.1 (ref 0.8–1.2)
Prothrombin Time: 13.5 seconds (ref 11.4–15.2)

## 2020-03-30 SURGERY — BREAST LUMPECTOMY WITH RADIO FREQUENCY LOCALIZER
Anesthesia: General | Laterality: Left

## 2020-03-30 SURGERY — RADIOACTIVE SEED GUIDED BREAST BIOPSY
Anesthesia: General | Site: Breast | Laterality: Left

## 2020-03-30 MED ORDER — BUPIVACAINE-EPINEPHRINE (PF) 0.5% -1:200000 IJ SOLN
INTRAMUSCULAR | Status: AC
  Filled 2020-03-30: qty 30

## 2020-03-30 MED ORDER — ONDANSETRON HCL 4 MG/2ML IJ SOLN: 4.0000 mg | Freq: Once | INTRAMUSCULAR | Status: DC | PRN

## 2020-03-30 MED ORDER — BUPIVACAINE-EPINEPHRINE (PF) 0.5% -1:200000 IJ SOLN
INTRAMUSCULAR | Status: DC | PRN
  Administered 2020-03-30 (×2): 10 mL via PERINEURAL

## 2020-03-30 MED ORDER — EPHEDRINE SULFATE 50 MG/ML IJ SOLN
INTRAMUSCULAR | Status: DC | PRN
  Administered 2020-03-30 (×5): 5 mg via INTRAVENOUS

## 2020-03-30 MED ORDER — HYDROCODONE-ACETAMINOPHEN 5-325 MG PO TABS: 1.0000 | ORAL_TABLET | ORAL | 0 refills | Status: AC | PRN

## 2020-03-30 MED ORDER — PROPOFOL 10 MG/ML IV BOLUS
INTRAVENOUS | Status: AC
  Filled 2020-03-30: qty 20

## 2020-03-30 MED ORDER — CHLORHEXIDINE GLUCONATE 0.12 % MT SOLN: 15.0000 mL | Freq: Once | OROMUCOSAL | Status: AC

## 2020-03-30 MED ORDER — FENTANYL CITRATE (PF) 100 MCG/2ML IJ SOLN: 25.0000 ug | INTRAMUSCULAR | Status: DC | PRN

## 2020-03-30 MED ORDER — LIDOCAINE HCL (PF) 2 % IJ SOLN
INTRAMUSCULAR | Status: AC
  Filled 2020-03-30: qty 5

## 2020-03-30 MED ORDER — EPHEDRINE 5 MG/ML INJ
INTRAVENOUS | Status: AC
  Filled 2020-03-30: qty 10

## 2020-03-30 MED ORDER — GLYCOPYRROLATE 0.2 MG/ML IJ SOLN
INTRAMUSCULAR | Status: AC
  Filled 2020-03-30: qty 1

## 2020-03-30 MED ORDER — FENTANYL CITRATE (PF) 100 MCG/2ML IJ SOLN
INTRAMUSCULAR | Status: DC | PRN
  Administered 2020-03-30: 25 ug via INTRAVENOUS
  Administered 2020-03-30: 75 ug via INTRAVENOUS

## 2020-03-30 MED ORDER — MIDAZOLAM HCL 2 MG/2ML IJ SOLN
INTRAMUSCULAR | Status: AC
  Filled 2020-03-30: qty 2

## 2020-03-30 MED ORDER — SODIUM CHLORIDE 0.9 % IV SOLN
INTRAVENOUS | Status: DC
  Administered 2020-03-30: 10 mL/h via INTRAVENOUS

## 2020-03-30 MED ORDER — CHLORHEXIDINE GLUCONATE 0.12 % MT SOLN
OROMUCOSAL | Status: AC
  Administered 2020-03-30: 15 mL via OROMUCOSAL
  Filled 2020-03-30: qty 15

## 2020-03-30 MED ORDER — ONDANSETRON HCL 4 MG/2ML IJ SOLN
INTRAMUSCULAR | Status: AC
  Filled 2020-03-30: qty 2

## 2020-03-30 MED ORDER — LIDOCAINE HCL (CARDIAC) PF 100 MG/5ML IV SOSY
PREFILLED_SYRINGE | INTRAVENOUS | Status: DC | PRN
  Administered 2020-03-30: 80 mg via INTRAVENOUS

## 2020-03-30 MED ORDER — ORAL CARE MOUTH RINSE: 15.0000 mL | Freq: Once | OROMUCOSAL | Status: AC

## 2020-03-30 MED ORDER — GLYCOPYRROLATE 0.2 MG/ML IJ SOLN
INTRAMUSCULAR | Status: DC | PRN
  Administered 2020-03-30 (×2): .1 mg via INTRAVENOUS

## 2020-03-30 MED ORDER — PROPOFOL 10 MG/ML IV BOLUS
INTRAVENOUS | Status: DC | PRN
  Administered 2020-03-30: 110 mg via INTRAVENOUS
  Administered 2020-03-30: 50 mg via INTRAVENOUS

## 2020-03-30 MED ORDER — FENTANYL CITRATE (PF) 100 MCG/2ML IJ SOLN
INTRAMUSCULAR | Status: AC
  Filled 2020-03-30: qty 2

## 2020-03-30 MED ORDER — ONDANSETRON HCL 4 MG/2ML IJ SOLN
INTRAMUSCULAR | Status: DC | PRN
  Administered 2020-03-30: 4 mg via INTRAVENOUS

## 2020-03-30 MED ORDER — CEFAZOLIN SODIUM-DEXTROSE 2-4 GM/100ML-% IV SOLN
INTRAVENOUS | Status: AC
  Filled 2020-03-30: qty 100

## 2020-03-30 SURGICAL SUPPLY — 41 items
BLADE SURG 15 STRL LF DISP TIS (BLADE) ×1 IMPLANT
BLADE SURG 15 STRL SS (BLADE) ×1
CANISTER SUCT 1200ML W/VALVE (MISCELLANEOUS) ×2 IMPLANT
CHLORAPREP W/TINT 26 (MISCELLANEOUS) ×2 IMPLANT
CNTNR SPEC 2.5X3XGRAD LEK (MISCELLANEOUS) ×1
CONT SPEC 4OZ STER OR WHT (MISCELLANEOUS) ×1
CONTAINER SPEC 2.5X3XGRAD LEK (MISCELLANEOUS) ×1 IMPLANT
COVER WAND RF STERILE (DRAPES) ×2 IMPLANT
DERMABOND ADVANCED (GAUZE/BANDAGES/DRESSINGS) ×1
DERMABOND ADVANCED .7 DNX12 (GAUZE/BANDAGES/DRESSINGS) ×1 IMPLANT
DEVICE DUBIN SPECIMEN MAMMOGRA (MISCELLANEOUS) ×2 IMPLANT
DRAPE LAPAROTOMY TRNSV 106X77 (MISCELLANEOUS) ×2 IMPLANT
ELECT CAUTERY BLADE TIP 2.5 (TIP) ×2
ELECT REM PT RETURN 9FT ADLT (ELECTROSURGICAL) ×2
ELECTRODE CAUTERY BLDE TIP 2.5 (TIP) ×1 IMPLANT
ELECTRODE REM PT RTRN 9FT ADLT (ELECTROSURGICAL) ×1 IMPLANT
GLOVE BIO SURGEON STRL SZ 6.5 (GLOVE) ×2 IMPLANT
GLOVE BIOGEL PI IND STRL 6.5 (GLOVE) ×1 IMPLANT
GLOVE BIOGEL PI INDICATOR 6.5 (GLOVE) ×1
GOWN STRL REUS W/ TWL LRG LVL3 (GOWN DISPOSABLE) ×3 IMPLANT
GOWN STRL REUS W/TWL LRG LVL3 (GOWN DISPOSABLE) ×3
KIT MARKER MARGIN INK (KITS) ×2 IMPLANT
KIT TURNOVER KIT A (KITS) ×2 IMPLANT
LABEL OR SOLS (LABEL) ×2 IMPLANT
MARKER MARGIN CORRECT CLIP (MARKER) ×2 IMPLANT
NEEDLE HYPO 25X1 1.5 SAFETY (NEEDLE) ×2 IMPLANT
PACK BASIN MINOR (MISCELLANEOUS) ×2 IMPLANT
RETRACTOR RING XSMALL (MISCELLANEOUS) IMPLANT
RTRCTR WOUND ALEXIS 13CM XS SH (MISCELLANEOUS)
SET LOCALIZER 20 PROBE US (MISCELLANEOUS) ×2 IMPLANT
SUT ETHILON 3-0 FS-10 30 BLK (SUTURE)
SUT MNCRL 4-0 (SUTURE) ×1
SUT MNCRL 4-0 27XMFL (SUTURE) ×1
SUT SILK 2 0 SH (SUTURE) ×2 IMPLANT
SUT VIC AB 3-0 SH 27 (SUTURE) ×1
SUT VIC AB 3-0 SH 27X BRD (SUTURE) ×1 IMPLANT
SUTURE EHLN 3-0 FS-10 30 BLK (SUTURE) IMPLANT
SUTURE MNCRL 4-0 27XMF (SUTURE) ×1 IMPLANT
SYR 10ML LL (SYRINGE) ×2 IMPLANT
SYR BULB IRRIG 60ML STRL (SYRINGE) ×2 IMPLANT
WATER STERILE IRR 1000ML POUR (IV SOLUTION) ×2 IMPLANT

## 2020-03-30 NOTE — Anesthesia Preprocedure Evaluation (Signed)
Anesthesia Evaluation  Patient identified by MRN, date of birth, ID band Patient awake    Reviewed: Allergy & Precautions, NPO status , Patient's Chart, lab work & pertinent test results  History of Anesthesia Complications (+) history of anesthetic complications (allergy to vecuronium)  Airway Mallampati: III  TM Distance: >3 FB Neck ROM: Full    Dental  (+) Poor Dentition   Pulmonary neg pulmonary ROS, neg sleep apnea, neg COPD,    breath sounds clear to auscultation- rhonchi (-) wheezing      Cardiovascular hypertension, Pt. on medications + CAD and + CABG  (-) Past MI and (-) Cardiac Stents  Rhythm:Regular Rate:Normal - Systolic murmurs and - Diastolic murmurs    Neuro/Psych neg Seizures negative neurological ROS  negative psych ROS   GI/Hepatic Neg liver ROS, GERD  ,  Endo/Other  diabetes (diet controlled)  Renal/GU negative Renal ROS     Musculoskeletal negative musculoskeletal ROS (+)   Abdominal (+) + obese,   Peds  Hematology negative hematology ROS (+)   Anesthesia Other Findings Past Medical History: No date: A-fib (HCC) No date: Bradycardia No date: Chronic kidney disease     Comment:  stage 3 No date: Complication of anesthesia     Comment:  vecuronium = rash (during cabg) No date: Coronary artery disease No date: Diabetes mellitus without complication (HCC)     Comment:  diet controlled No date: Diverticulitis No date: GERD (gastroesophageal reflux disease) No date: HLD (hyperlipidemia) No date: Hypertension 2021: Intraductal papilloma of breast, left No date: Moderate mitral insufficiency 2012: Pulmonary embolism ([place])     Comment:  left lung post CABG   Reproductive/Obstetrics                             Anesthesia Physical Anesthesia Plan  ASA: III  Anesthesia Plan: General   Post-op Pain Management:    Induction: Intravenous  PONV Risk Score and  Plan: 2 and Ondansetron and Dexamethasone  Airway Management Planned: LMA  Additional Equipment:   Intra-op Plan:   Post-operative Plan:   Informed Consent: I have reviewed the patients History and Physical, chart, labs and discussed the procedure including the risks, benefits and alternatives for the proposed anesthesia with the patient or authorized representative who has indicated his/her understanding and acceptance.     Dental advisory given  Plan Discussed with: CRNA and Anesthesiologist  Anesthesia Plan Comments:         Anesthesia Quick Evaluation

## 2020-03-30 NOTE — H&P (Signed)
PATIENT PROFILE: [name] is a 79 y.o. female who presents today to OR for excisional biopsy of left breast papilloma.  PCP: Dimas Chyle, MD  HISTORY OF PRESENT ILLNESS: [name] reports reports she had her usual yearly mammogram in October 2020. At that time a small round mass was identified on the left breast 5 o'clock position. This was read as probably benign and mammogram in 6 months was recommended for close follow-up. Due to the change in the shape of the mass and ultrasound-guided biopsy was recommended. Core biopsy shows findings concerning of intraductal papilloma. Surgical referral was recommended for excision. Patient denies any palpable mass, skin changes, nipple retraction or discharge.  Family history of breast cancer: None Family history of other cancers: None Menarche: 79 years old Menopause: Hysterectomy in her 38s Used OCP: None Used estrogen and progesterone therapy: Yes History of Radiation to the chest: No Age of first pregnancy: 79 years old Number of pregnancies: 2 Previous breast biopsy: 2 on the right breast (benign)  PROBLEM LIST: Problem List Date Reviewed: 08/04/2019  Noted  Allergic rhinitis 06/15/2019  Bradycardia 11/13/2016  Microscopic hematuria 08/26/2015  FH: colon polyps 12/01/2014  Mixed hyperlipidemia 07/28/2014  Paroxysmal A-fib (CMS-HCC) 07/06/2014  Moderate mitral insufficiency 07/06/2014  Type 2 diabetes mellitus, controlled (CMS-HCC) 03/13/2012  CAD (coronary artery disease) 09/04/2011  Overview  08/10/2011 3 vessel CABG- LIMA to LAD, SVG to obtuse marginal and RCA  Followed per Dr. Nehemiah Massed at [facility]   History of pulmonary embolism 09/04/2011  Overview  2012   Long term (current) use of anticoagulants 09/04/2011  Essential hypertension, benign 07/16/2011    GENERAL REVIEW OF SYSTEMS:   General ROS: negative for - chills, fatigue, fever, weight gain or weight loss Allergy and  Immunology ROS: negative for - hives  Hematological and Lymphatic ROS: negative for - bleeding problems or bruising, negative for palpable nodes Endocrine ROS: negative for - heat or cold intolerance, hair changes Respiratory ROS: negative for - cough, shortness of breath or wheezing Cardiovascular ROS: no chest pain or palpitations GI ROS: negative for nausea, vomiting, abdominal pain, diarrhea, constipation Musculoskeletal ROS: negative for - joint swelling or muscle pain Neurological ROS: negative for - confusion, syncope Dermatological ROS: negative for pruritus and rash Psychiatric: negative for anxiety, depression, difficulty sleeping and memory loss  MEDICATIONS: Current Outpatient Medications  Medication Sig Dispense Refill  . blood glucose diagnostic test strip Check fasting blood sugar daily E11.9 100 each 12  . blood glucose meter (ONETOUCH ULTRA2 METER) kit by XX route as directed 1 each 0  . carvediloL (COREG) 3.125 MG tablet Take 1 tablet (3.125 mg total) by mouth 2 (two) times daily with meals 60 tablet 11  . cetirizine (ZYRTEC) 10 mg capsule Take 1 capsule (10 mg total) by mouth once daily 30 capsule 11  . co-enzyme Q-10, ubiquinone, 200 mg capsule Take 200 mg by mouth once daily.  Marland Kitchen diltiazem (CARDIZEM CD) 120 MG XR capsule TAKE 1 CAPSULE BY MOUTH EVERY DAY 90 capsule 1  . lancets Check fasting blood sugar daily E11.9 100 each 12  . magnesium 200 mg Take by mouth once daily  . multivit-mineral-iron-lutein Tab 1 tab by mouth daily  . pantoprazole (PROTONIX) 40 MG DR tablet Take 1 tablet (40 mg total) by mouth once daily 90 tablet 11  . pravastatin (PRAVACHOL) 40 MG tablet Take 1 tablet (40 mg total) by mouth once daily 90 tablet 3  . quinapriL (ACCUPRIL) 40 MG tablet  Take 1 tablet (40 mg total) by mouth once daily 90 tablet 0  . SALMON OIL/OMEGA-3 FATTY ACIDS (SALMON OIL-1000 ORAL) Take by mouth.  . spironolactone (ALDACTONE) 50 MG tablet TAKE 1 TABLET(50 MG) BY MOUTH EVERY  DAY 90 tablet 1  . warfarin (COUMADIN) 3 MG tablet Take 1 tablet (3 mg total) by mouth once daily 90 tablet 11  . warfarin (COUMADIN) 4 MG tablet Take 1 tablet (4 mg total) by mouth once daily 90 tablet 3   No current facility-administered medications for this visit.   ALLERGIES: Statins-hmg-coa reductase inhibitors; Vecuronium bromide; and Asa buff (mag carb-al glyc) [aspirin,buff(mag crb-aluminum)]  PAST MEDICAL HISTORY: Past Medical History:  Diagnosis Date  . Atrial fibrillation (CMS-HCC)  . CAD (coronary artery disease) 09/04/2011  3 vessel CABG  . FH: colon polyps 12/01/2014  . History of pulmonary embolism 09/04/2011  . Hypercholesteremia  . Hyperglycemia 03/13/2012  . Hyperlipidemia  . Hypertension  . Obesity 07/16/2011  . Venous thrombosis  . VHD (valvular heart disease)   PAST SURGICAL HISTORY: Past Surgical History:  Procedure Laterality Date  . CHOLECYSTECTOMY  . COLOGUARD 02/01/2015  Negative: Do Hemoccult Cards in 3 yrs per RTE: CBF 01/2018; Recall Ltr mailed 12/18/2017 (dh)  . COLONOSCOPY 11/13/2000  Int Hemorrhoids: CBF 10/2010  . CORONARY ARTERY BYPASS GRAFT 07/2011  x 3-[facility]  . EGD 11/13/2000  . EXTRACTION CATARACT EXTRACAPSULAR W/INSERTION INTRAOCULAR PROSTHESIS Left 03/22/2014  Procedure: EXTRACTION CATARACT EXTRACAPSULAR W/INSERTION INTRAOCULAR PROSTHESIS; Surgeon: Ross Ludwig, MD; Location: [facility]; Service: Ophthalmology; Laterality: Left; RESTOR  . EXTRACTION CATARACT EXTRACAPSULAR W/INSERTION INTRAOCULAR PROSTHESIS Right 04/05/2014  Procedure: EXTRACTION CATARACT EXTRACAPSULAR W/INSERTION INTRAOCULAR PROSTHESIS; Surgeon: Ross Ludwig, MD; Location: [facility]; Service: Ophthalmology; Laterality: Right;  . HYSTERECTOMY    FAMILY HISTORY: Family History  Problem Relation Age of Onset  . Coronary Artery Disease (Blocked arteries around heart) Mother  . Heart failure Mother  . Stroke Mother  . Coronary Artery Disease (Blocked  arteries around heart) Father  . Colon polyps Father  . Myocardial Infarction (Heart attack) Father  . Coronary Artery Disease (Blocked arteries around heart) Sister  . Pacemaker Sister  . No Known Problems Daughter  . No Known Problems Son  . No Known Problems Sister    SOCIAL HISTORY: Social History   Socioeconomic History  . Marital status: Widowed  Spouse name: Not on file  . Number of children: Not on file  . Years of education: Not on file  . Highest education level: Not on file  Occupational History  . Not on file  Tobacco Use  . Smoking status: Never Smoker  . Smokeless tobacco: Never Used  Vaping Use  . Vaping Use: Never used  Substance and Sexual Activity  . Alcohol use: No  Alcohol/week: 0.0 standard drinks  . Drug use: No  . Sexual activity: Not Currently  Other Topics Concern  . Not on file  Social History Narrative  Widow, husband died in 02-26-2013. Married x 54 years. 2 kids, 2 grandkids. Used to work at [employer]. Wears seatbelt, sunscreen. Exercise: she plans to start walking again soon. Dentist: yes. Calcium in diet: yes. No relgious beliefs affecting health care. She is Ep[employer]opalian.  Has Living Will - her children has copy.   Social Determinants of Health   Financial Resource Strain:  . Difficulty of Paying Living Expenses:  Food Insecurity:  . Worried About Charity fundraiser in the Last Year:  . Arboriculturist in  the Last Year:  Transportation Needs:  . Film/video editor (Medical):  Marland Kitchen Lack of Transportation (Non-Medical):   PHYSICAL EXAM: Vitals:  01/21/20 1027  BP: 161/83  Pulse: 55   Body mass index is 32.32 kg/m. Weight: 72.6 kg (160 lb)   GENERAL: Alert, active, oriented x3  HEENT: Pupils equal reactive to light. Extraocular movements are intact. Sclera clear. Palpebral conjunctiva normal red color.Pharynx clear.  NECK: Supple with no palpable mass and no adenopathy.  LUNGS: Sound clear with no rales rhonchi  or wheezes.  HEART: Regular rhythm S1 and S2 without murmur.  BREAST: breasts appear normal, no suspicious masses, no skin or nipple changes or axillary nodes.  ABDOMEN: Soft and depressible, nontender with no palpable mass, no hepatomegaly.  EXTREMITIES: Well-developed well-nourished symmetrical with no dependent edema.  NEUROLOGICAL: Awake alert oriented, facial expression symmetrical, moving all extremities.  REVIEW OF DATA: I have reviewed the following data today: Anti-coag visit on 12/30/2019  Component Date Value  . Prothrombin Time, Whole * 12/30/2019 30.0*  . Prothrombin INR WB 12/30/2019 2.5*  Anti-coag visit on 12/02/2019  Component Date Value  . Prothrombin Time, Whole * 12/02/2019 35.8*  . Prothrombin INR WB 12/02/2019 3.0*  Anti-coag visit on 11/18/2019  Component Date Value  . Prothrombin Time, Whole * 11/18/2019 33.3*  . Prothrombin INR WB 11/18/2019 2.8*  Anti-coag visit on 11/11/2019  Component Date Value  . Prothrombin Time, Whole * 11/11/2019 45.8*  . Prothrombin INR WB 11/11/2019 3.8*  . Prothrombin Time, Whole * 11/11/2019 47.5*  . Prothrombin INR WB 11/11/2019 4.0*    ASSESSMENT: [name] is a 79 y.o. female presenting for excisional biopsy of left breast intraductal papilloma.   Patient was oriented again about the pathology results. Surgical alternatives were discussed with patient including excisional biopsy. Patient was oriented that the purpose of the biopsy is to rule out a malignancy and to decrease the risk of this mass converting to cancer in the future. Surgical technique and post operative care was discussed with patient. Risk of surgery was discussed with patient including but not limited to: wound infection, seroma, hematoma, brachial plexopathy, mondor's disease (thrombosis of small veins of breast), chronic wound pain, breast lymphedema, altered sensation to the nipple and cosmesis among others.   Discussed with patient about the  slightly higher risk of bleeding due to her chronic use of Coumadin. We will consult cardiology for recommendation of perioperative management of Coumadin  Intraductal papilloma of breast, left [D24.2]  PLAN: 1. Left breast radiofrequency tag guided excisional biopsy   Patient and her daughter verbalized understanding, all questions were answered, and were agreeable with the plan outlined above.   Herbert Pun, MD

## 2020-03-30 NOTE — Anesthesia Postprocedure Evaluation (Signed)
Anesthesia Post Note  Patient: [name]  Procedure(s) Performed: BREAST LUMPECTOMY WITH RADIO FREQUENCY LOCALIZER (Left )  Patient location during evaluation: PACU Anesthesia Type: General Level of consciousness: awake and alert and oriented Pain management: pain level controlled Vital Signs Assessment: post-procedure vital signs reviewed and stable Respiratory status: spontaneous breathing, nonlabored ventilation and respiratory function stable Cardiovascular status: blood pressure returned to baseline and stable Postop Assessment: no signs of nausea or vomiting Anesthetic complications: no   No complications documented.   Last Vitals:  Vitals:   03/30/20 0910 03/30/20 0921  BP: (!) 116/56 (!) 131/54  Pulse: (!) 59 (!) 58  Resp: (!) 9 16  Temp:  (!) 36.3 C  SpO2: 99% 97%    Last Pain:  Vitals:   03/30/20 0921  TempSrc: Temporal  PainSc: 0-No pain                 Rochele Lueck

## 2020-03-30 NOTE — Discharge Instructions (Signed)
  Diet: Resume home heart healthy regular diet.   Activity: Increase activity gradually as tolerated, but light activity and walking are encouraged. Do not drive or drink alcohol if taking narcotic pain medications.  Wound care: May shower with soapy water and pat dry (do not rub incisions), but no baths or submerging incision underwater until follow-up. (no swimming)   Medications: Resume all home medications. For mild to moderate pain: acetaminophen (Tylenol) or ibuprofen (if no kidney disease). Combining Tylenol with alcohol can substantially increase your risk of causing liver disease. Narcotic pain medications, if prescribed, can be used for severe pain, though may cause nausea, constipation, and drowsiness. Do not combine Tylenol and Norco within a 6 hour period as Norco contains Tylenol. If you do not need the narcotic pain medication, you do not need to fill the prescription.  Call office [phone]) at any time if any questions, worsening pain, fevers/chills, bleeding, drainage from incision site, or other concerns.   AMBULATORY SURGERY  DISCHARGE INSTRUCTIONS   1) The drugs that you were given will stay in your system until tomorrow so for the next 24 hours you should not:  A) Drive an automobile B) Make any legal decisions C) Drink any alcoholic beverage   2) You may resume regular meals tomorrow.  Today it is better to start with liquids and gradually work up to solid foods.  You may eat anything you prefer, but it is better to start with liquids, then soup and crackers, and gradually work up to solid foods.   3) Please notify your doctor immediately if you have any unusual bleeding, trouble breathing, redness and pain at the surgery site, drainage, fever, or pain not relieved by medication.    4) Additional Instructions:        Please contact your physician with any problems or Same Day Surgery at [phone], Monday through Friday 6 am to 4 pm, or [place]  at [facility] number at [phone].

## 2020-03-30 NOTE — Transfer of Care (Signed)
Immediate Anesthesia Transfer of Care Note  Patient: [name]  Procedure(s) Performed: BREAST LUMPECTOMY WITH RADIO FREQUENCY LOCALIZER (Left )  Patient Location: PACU  Anesthesia Type:General  Level of Consciousness: awake, alert  and oriented  Airway & Oxygen Therapy: Patient Spontanous Breathing and Patient connected to nasal cannula oxygen  Post-op Assessment: Report given to RN and Post -op Vital signs reviewed and stable  Post vital signs: Reviewed and stable  Last Vitals:  Vitals Value Taken Time  BP 118/62 03/30/20 0833  Temp    Pulse 70 03/30/20 0835  Resp 15 03/30/20 0835  SpO2 100 % 03/30/20 0835  Vitals shown include unvalidated device data.  Last Pain:  Vitals:   03/30/20 0621  TempSrc: Tympanic         Complications: No complications documented.

## 2020-03-30 NOTE — Op Note (Signed)
Preoperative diagnosis: Left breast intraductal papilloma  Postoperative diagnosis: Left breast intraductal papilloma.   Procedure: Left radiofrequency tag-localized excisional biopsy.                       Anesthesia: GETA  Surgeon: Dr. Windell Moment  Wound Classification: Clean  Indications: Patient is a 79 y.o. female with a nonpalpable left breast mass noted on mammography with core biopsy demonstrating intraductal papilloma requires radiofrequency tag-localized excisional biopsy to rule out malignancy.   Findings: 1. Specimen mammography shows marker and tag on specimen 2. No other palpable mass or lymph node identified.  3. Adequate hemostasis  Description of procedure: Preoperative radiofrequency tag localization was performed by radiology.  Localization studies were reviewed. The patient was taken to the operating room and placed supine on the operating table, and after general anesthesia the left chest and axilla were prepped and draped in the usual sterile fashion. A time-out was completed verifying correct patient, procedure, site, positioning, and implant(s) and/or special equipment prior to beginning this procedure.  By comparing the localization studies and interrogation with Localizer device, the probable trajectory and location of the mass was visualized. A circumareolar skin incision was planned in such a way as to minimize the amount of dissection to reach the mass.  The skin incision was made. Flaps were raised and the location of the tag was confirmed with Localizer device confirmed. A 2-0 silk figure-of-eight stay suture was placed and used for retraction. Dissection was then taken down circumferentially, taking care to include the entire localizing tag and a wide margin of grossly normal tissue. The specimen and entire localizing tag were removed. The specimen was oriented and sent to radiology with the localization studies. The wound was irrigated. Hemostasis was checked.  The wound was closed with interrupted sutures of 3-0 Vicryl and a subcuticular suture of Monocryl 3-0. No attempt was made to close the dead space.   Specimen: Left Breast excisional biopsy                     Complications: None  Estimated Blood Loss: 10 mL

## 2020-03-31 LAB — SURGICAL PATHOLOGY

## 2020-04-01 ENCOUNTER — Telehealth: Payer: Self-pay

## 2020-04-01 NOTE — Telephone Encounter (Signed)
Pt. Was contacted.  Reported she was aware of the water contamination in [place].  Reassured pt. That it was noted to involve a localized area and not believed to be widespread.  Advised that the no water contamination was detected at [facility].  Advised to report diarrhea, stomach cramps, nausea or vomiting to her PCP.  Reassured pt. That it is very remote chance that she was infected, but to monitor for the above symptoms.  Verb. Understanding.

## 2021-03-16 ENCOUNTER — Other Ambulatory Visit: Payer: Self-pay | Admitting: Family Medicine

## 2021-03-16 ENCOUNTER — Ambulatory Visit: Payer: Medicare Other | Attending: Family Medicine

## 2021-03-16 DIAGNOSIS — I739 Peripheral vascular disease, unspecified: Secondary | ICD-10-CM

## 2021-03-16 DIAGNOSIS — M7989 Other specified soft tissue disorders: Secondary | ICD-10-CM

## 2021-03-16 DIAGNOSIS — R252 Cramp and spasm: Secondary | ICD-10-CM

## 2021-03-22 ENCOUNTER — Other Ambulatory Visit: Payer: Self-pay

## 2021-03-22 ENCOUNTER — Ambulatory Visit
Admission: RE | Admit: 2021-03-22 | Discharge: 2021-03-22 | Disposition: A | Discharge status: Home / Self Care | Payer: Medicare Other | Source: Ambulatory Visit | Attending: Family Medicine | Admitting: Family Medicine

## 2021-03-22 DIAGNOSIS — M7989 Other specified soft tissue disorders: Secondary | ICD-10-CM

## 2021-03-23 ENCOUNTER — Ambulatory Visit
Admission: RE | Admit: 2021-03-23 | Discharge: 2021-03-23 | Disposition: A | Discharge status: Home / Self Care | Payer: Medicare Other | Source: Ambulatory Visit | Attending: Family Medicine | Admitting: Family Medicine

## 2021-03-23 DIAGNOSIS — R252 Cramp and spasm: Secondary | ICD-10-CM | POA: Insufficient documentation

## 2021-03-23 DIAGNOSIS — I739 Peripheral vascular disease, unspecified: Secondary | ICD-10-CM | POA: Diagnosis present

## 2021-04-12 LAB — EXTERNAL GENERIC LAB PROCEDURE: COLOGUARD: NEGATIVE

## 2021-04-12 LAB — COLOGUARD: COLOGUARD: NEGATIVE

## 2022-02-03 ENCOUNTER — Encounter: Payer: Self-pay | Admitting: Emergency Medicine

## 2022-02-03 ENCOUNTER — Ambulatory Visit
Admission: EM | Admit: 2022-02-03 | Discharge: 2022-02-03 | Disposition: A | Discharge status: Home / Self Care | Payer: Medicare Other | Attending: Student | Admitting: Student

## 2022-02-03 ENCOUNTER — Other Ambulatory Visit: Payer: Self-pay

## 2022-02-03 DIAGNOSIS — R1013 Epigastric pain: Secondary | ICD-10-CM | POA: Insufficient documentation

## 2022-02-03 DIAGNOSIS — K219 Gastro-esophageal reflux disease without esophagitis: Secondary | ICD-10-CM | POA: Diagnosis present

## 2022-02-03 LAB — URINALYSIS, ROUTINE W REFLEX MICROSCOPIC
Bilirubin Urine: NEGATIVE
Glucose, UA: NEGATIVE mg/dL
Hgb urine dipstick: NEGATIVE
Ketones, ur: NEGATIVE mg/dL
Leukocytes,Ua: NEGATIVE
Nitrite: NEGATIVE
Protein, ur: NEGATIVE mg/dL
Specific Gravity, Urine: 1.015 (ref 1.005–1.030)
pH: 5.5 (ref 5.0–8.0)

## 2022-02-03 MED ORDER — LIDOCAINE VISCOUS HCL 2 % MT SOLN
15.0000 mL | Freq: Once | OROMUCOSAL | Status: AC
  Administered 2022-02-03: 15 mL via ORAL

## 2022-02-03 MED ORDER — ALUM & MAG HYDROXIDE-SIMETH 200-200-20 MG/5ML PO SUSP
30.0000 mL | Freq: Once | ORAL | Status: AC
Start: 2022-02-03 — End: 2022-02-03
  Administered 2022-02-03: 30 mL via ORAL

## 2022-02-03 MED ORDER — FAMOTIDINE 20 MG PO TABS: 20.0000 mg | ORAL_TABLET | Freq: Two times a day (BID) | ORAL | 0 refills | Status: AC

## 2022-02-03 NOTE — Discharge Instructions (Addendum)
-  Continue Protonix daily.  Start the Pepcid twice daily taken with meals. -Follow-up with primary care provider on Monday 5/22 if symptoms persist, or head to the emergency department if they worsen over the weekend.  This includes worsening abdominal pain, pain radiating to your back, new chest pain, new dizziness, new weakness, new shortness of breath.

## 2022-02-03 NOTE — ED Notes (Signed)
Patient reports "dizziness" remains "especially when leaning back in the chair". Provider notified. I was able to pull son Yvone Neu) back to room with her. He will remain with her in the room for another 5+ mins until re-eval. By provider.  B. Roten CMA

## 2022-02-03 NOTE — ED Provider Notes (Signed)
[facility]    CSN: [id number] Arrival date & time: 02/03/22  0858      History   Chief Complaint Chief Complaint  Patient presents with   Abdominal Pain    Epigastric Pain    HPI [name] is a 81 y.o. female presenting with epigastric pain for about 24 hours.  History of GERD, currently takes Protonix 40 mg daily for this.  She describes epigastric pain, but no associated nausea, vomiting, diarrhea.  Last bowel movement was today and was normal.  There is possibly slight radiation of the pain to the back, but this is tolerable.  She states that her diet has been normal, without excessive spicy foods, caffeine, citrus, NSAIDs.  She has been taking the Protonix 40 mg daily as directed, but has not attempted any other medications at home.  Denies chest pain, shortness of breath, dizziness, weakness.  History of abdominal hysterectomy, no other history of abdominal procedures per patient.  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   Epigastric abdominal pain 01/14/2017   Malignant essential hypertension 01/14/2017   Diabetes mellitus ([place]) 01/14/2017   CVA (cerebral infarction) 06/02/2015    Past Surgical History:  Procedure Laterality Date   ABDOMINAL HYSTERECTOMY     BREAST BIOPSY Right    bx x2-neg   BREAST BIOPSY Left 01/11/2020   [place] Bx, venus clip, papilloma   BREAST LUMPECTOMY WITH RADIO FREQUENCY  LOCALIZER Left 03/30/2020   Procedure: BREAST LUMPECTOMY WITH RADIO FREQUENCY LOCALIZER;  Surgeon: Herbert Pun, MD;  Location: [facility] ORS;  Service: General;  Laterality: Left;   CARDIAC CATHETERIZATION     CARDIAC SURGERY     CATARACT EXTRACTION W/ INTRAOCULAR LENS  IMPLANT, BILATERAL Bilateral    CHOLECYSTECTOMY     CORONARY ARTERY BYPASS GRAFT  08/10/2011   3 vessels   EXCISION NASAL MASS Left 08/19/2017   Procedure: EXCISION NASAL LESION - LEFT - EXTERNAL APPROACH;  Surgeon: Margaretha Sheffield, MD;  Location: [facility] ORS;  Service: ENT;  Laterality: Left;   EYE SURGERY      OB History   No obstetric history on file.      Home Medications    Prior to Admission medications   Medication Sig Start Date End Date Taking? Authorizing Provider  carvedilol (COREG) 3.125 MG tablet Take 3.125 mg by mouth 2 (two) times daily with a meal.   Yes [provider]  cetirizine (ZYRTEC) 10 MG tablet Take 10 mg by mouth daily.   Yes [provider]  diltiazem (CARDIZEM CD) 120 MG 24 hr capsule Take 1 capsule (120 mg total) by mouth daily. Patient taking differently: Take 120 mg by mouth at bedtime. 01/15/17  Yes Wieting, Richard, MD  famotidine (PEPCID) 20 MG tablet Take 1 tablet (20 mg total) by mouth 2 (two) times daily. 02/03/22  Yes Hazel Sams, PA-C  Magnesium Oxide (MAG-200) 200 MG TABS Take 200 mg by mouth daily.   Yes [provider]  Multiple Vitamins-Minerals (CENTRUM SILVER PO) Take 1 tablet at bedtime by mouth.    Yes [provider]  pantoprazole (PROTONIX) 40 MG tablet Take 1 tablet (40 mg total) by mouth daily. 01/16/17  Yes Wieting, Richard, MD  pravastatin (PRAVACHOL) 40 MG tablet Take 40 mg by mouth at bedtime.    Yes [provider]  spironolactone (ALDACTONE) 50 MG tablet Take 50 mg by mouth daily.   Yes [provider]  warfarin (COUMADIN) 4 MG tablet Take 2 mg by mouth every Sunday.   Yes [provider]   acetaminophen (TYLENOL) 325 MG tablet Take 325 mg every 6 (six) hours as needed by mouth for moderate pain or headache.    [provider]  Co-Enzyme Q10 200 MG CAPS Take 200 mg daily by mouth.     [provider]  Nutritional Supplements (SALMON OIL) CAPS Take 1 capsule by mouth daily.    [provider]  Polyvinyl Alcohol-Povidone (REFRESH OP) Place 1 drop as needed into both eyes (for dry eyes).    [provider]  quinapril (ACCUPRIL) 40 MG tablet Take 40 mg by mouth daily.    [provider]  warfarin (COUMADIN) 3 MG tablet Take 1.5 mg by mouth See admin instructions. 1.5 mg on Wed, 3 mg Mon, Tues, Thurs, Fri, and Sat    [provider]    Family History Family History  Problem Relation Age of Onset   Congestive Heart Failure Mother    CVA Mother    COPD Father    Breast cancer Paternal Aunt     Social History Social History   Tobacco Use   Smoking status: Never   Smokeless tobacco: Never  Vaping Use   Vaping Use: Never used  Substance Use Topics   Alcohol use: No   Drug use: No     Allergies   Statins and Vecuronium   Review of Systems Review of Systems  Constitutional:  Negative for appetite change, chills, diaphoresis, fever and unexpected weight change.  HENT:  Negative for congestion, ear pain, sinus pressure, sinus pain, sneezing, sore throat and trouble swallowing.   Respiratory:  Negative for cough, chest tightness and shortness of breath.   Cardiovascular:  Negative for chest pain.  Gastrointestinal:  Positive for abdominal pain. Negative for abdominal distention, anal bleeding, blood in stool, constipation, diarrhea, nausea, rectal pain and vomiting.  Genitourinary:  Negative for dysuria, flank pain, frequency and urgency.  Musculoskeletal:  Negative for back pain and myalgias.  Neurological:  Negative for dizziness, light-headedness and headaches.  All other systems reviewed and are  negative.   Physical Exam Triage Vital Signs ED Triage Vitals  Enc Vitals Group     BP 02/03/22 0921 (!) 137/58     Pulse Rate 02/03/22 0921 (!) 52     Resp 02/03/22 0921 14     Temp 02/03/22 0921 97.7 F (36.5 C)     Temp Source 02/03/22 0921 Oral     SpO2 02/03/22 0921 100 %     Weight 02/03/22 0919 158 lb 1.1 oz (71.7 kg)     Height 02/03/22 0919 '4\' 11"'$  (1.499 m)     Head Circumference --      Peak Flow --      Pain Score 02/03/22 0919 8     Pain Loc --  Pain Edu? --      Excl. in [place]? --    No data found.  Updated Vital Signs BP 140/66 (BP Location: Right Arm)   Pulse (!) 52   Temp 97.7 F (36.5 C) (Oral)   Resp 14   Ht '4\' 11"'$  (1.499 m)   Wt 158 lb 1.1 oz (71.7 kg)   SpO2 100%   BMI 31.93 kg/m   Visual Acuity Right Eye Distance:   Left Eye Distance:   Bilateral Distance:    Right Eye Near:   Left Eye Near:    Bilateral Near:     Physical Exam Vitals reviewed.  Constitutional:      General: She is not in acute distress.    Appearance: Normal appearance. She is not ill-appearing.  HENT:     Head: Normocephalic and atraumatic.     Mouth/Throat:     Mouth: Mucous membranes are moist.     Comments: Moist mucous membranes Eyes:     Extraocular Movements: Extraocular movements intact.     Pupils: Pupils are equal, round, and reactive to light.  Cardiovascular:     Rate and Rhythm: Regular rhythm. Bradycardia present.     Pulses:          Radial pulses are 2+ on the right side and 2+ on the left side.     Heart sounds: Normal heart sounds.     Comments: Radial pulses are equal and regular. Pulmonary:     Effort: Pulmonary effort is normal.     Breath sounds: Normal breath sounds. No wheezing, rhonchi or rales.  Abdominal:     General: Bowel sounds are normal. There is no distension.     Palpations: Abdomen is soft. There is no mass.     Tenderness: There is abdominal tenderness in the epigastric area. There is no right CVA tenderness, left CVA  tenderness, guarding or rebound. Negative signs include Murphy's sign, Rovsing's sign and McBurney's sign.     Comments: Resting comfortably.  There is reproducible epigastric pain to palpation, without guarding or rebound.  No obvious mass or hernia.  Bowel sounds are positive throughout.  There is no right upper quadrant or right lower quadrant pain.  Musculoskeletal:     Right lower leg: No edema.     Left lower leg: No edema.  Skin:    General: Skin is warm.     Capillary Refill: Capillary refill takes less than 2 seconds.     Comments: Good skin turgor  Neurological:     General: No focal deficit present.     Mental Status: She is alert and oriented to person, place, and time.  Psychiatric:        Mood and Affect: Mood normal.        Behavior: Behavior normal.     UC Treatments / Results  Labs (all labs ordered are listed, but only abnormal results are displayed) Labs Reviewed  URINALYSIS, [place] MICROSCOPIC    EKG   Radiology No results found.  Procedures Procedures (including critical care time)  Medications Ordered in UC Medications  alum & mag hydroxide-simeth (MAALOX/MYLANTA) 200-200-20 MG/5ML suspension 30 mL (30 mLs Oral Given 02/03/22 0951)    And  lidocaine (XYLOCAINE) 2 % viscous mouth solution 15 mL (15 mLs Oral Given 02/03/22 0950)    Initial Impression / Assessment and Plan / UC Course  I have reviewed the triage vital signs and the nursing notes.  Pertinent labs & imaging results  that were available during my care of the patient were reviewed by me and considered in my medical decision making (see chart for details).     This patient is a very pleasant 81 y.o. year old female presenting with acute exacerbation of chronic GERD. HR is 52. Chart review from 3/23 indicates HR was also in 50s at that time. There is no associated dizziness, chest pain, shortness of breath today. Pulses are equal and regular. The abdominal pain is reproducible, and  significantly improved following GI cocktail. BP with <69mHg difference in bilateral arms, so low concern for aortic dissection.  EKG today unchanged from 01/2017 EKG.   She does have a diagnosis of GERD and is already taking Protonix '40mg'$  qd for this. Advised Pepcid bid, f/u with PCP if symptoms persist on next business day, or ED if they worsen. Strict ED return precautions discussed. Patient verbalizes understanding and agreement. Coding Level 4 for review of past notes/labs (EKGs), order and interpretation of labs today, and prescription drug management    Final Clinical Impressions(s) / UC Diagnoses   Final diagnoses:  Abdominal pain, epigastric  Gastroesophageal reflux disease without esophagitis     Discharge Instructions      -Continue Protonix daily.  Start the Pepcid twice daily taken with meals. -Follow-up with primary care provider on Monday 5/22 if symptoms persist, or head to the emergency department if they worsen over the weekend.  This includes worsening abdominal pain, pain radiating to your back, new chest pain, new dizziness, new weakness, new shortness of breath.     ED Prescriptions     Medication Sig Dispense Auth. Provider   famotidine (PEPCID) 20 MG tablet Take 1 tablet (20 mg total) by mouth 2 (two) times daily. 30 tablet GHazel Sams PA-C      PDMP not reviewed this encounter.   GHazel Sams PA-C 02/03/22 1007

## 2022-02-03 NOTE — ED Triage Notes (Signed)
Patient reports mid epigastric pain that started last night.  Patient denies any N/V.  Patient denies SOB.

## 2022-03-15 ENCOUNTER — Other Ambulatory Visit: Payer: Self-pay | Admitting: Family Medicine

## 2022-03-15 DIAGNOSIS — Z1231 Encounter for screening mammogram for malignant neoplasm of breast: Secondary | ICD-10-CM

## 2022-04-05 ENCOUNTER — Ambulatory Visit
Admission: RE | Admit: 2022-04-05 | Discharge: 2022-04-05 | Disposition: A | Discharge status: Home / Self Care | Payer: Medicare Other | Source: Ambulatory Visit | Attending: Family Medicine | Admitting: Family Medicine

## 2022-04-05 DIAGNOSIS — Z1231 Encounter for screening mammogram for malignant neoplasm of breast: Secondary | ICD-10-CM | POA: Insufficient documentation

## 2022-11-03 IMAGING — US US EXTREM LOW VENOUS*L*
1 series · 14 of 24 positions shown · non-contrast
Comparison: None.

CLINICAL DATA: Left lower extremity swelling

EXAM:
Left LOWER EXTREMITY VENOUS DOPPLER ULTRASOUND
TECHNIQUE: Gray-scale sonography with compression, as well as color and duplex
ultrasound, were performed to evaluate the deep venous system(s)
from the level of the common femoral vein through the popliteal and
proximal calf veins.

[Series 1: us venous img lower uni left (dvt) · portal-venous · 14 of 33 slices shown]
[im 1/33]
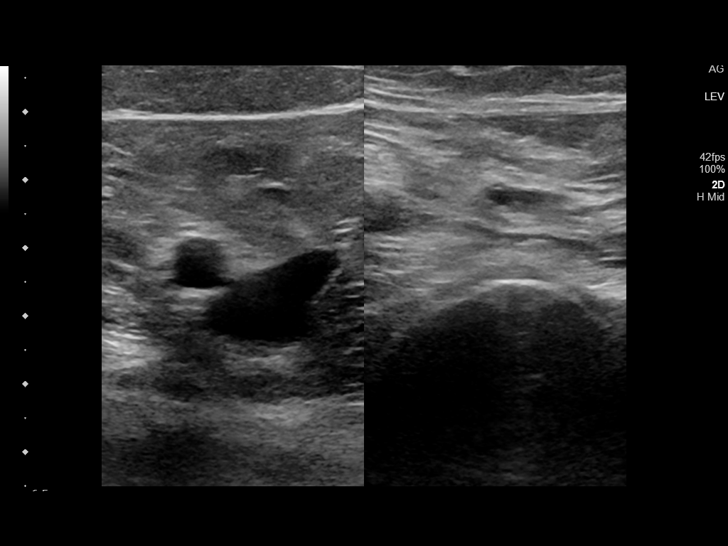
[im 3/33]
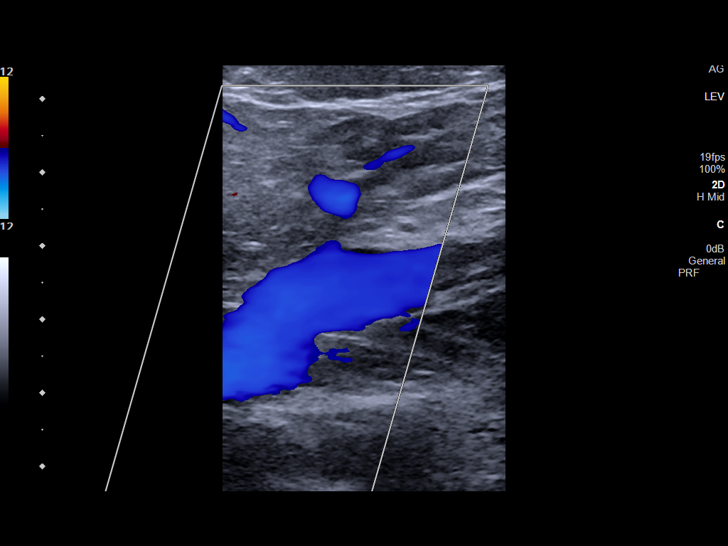
[im 6/33]
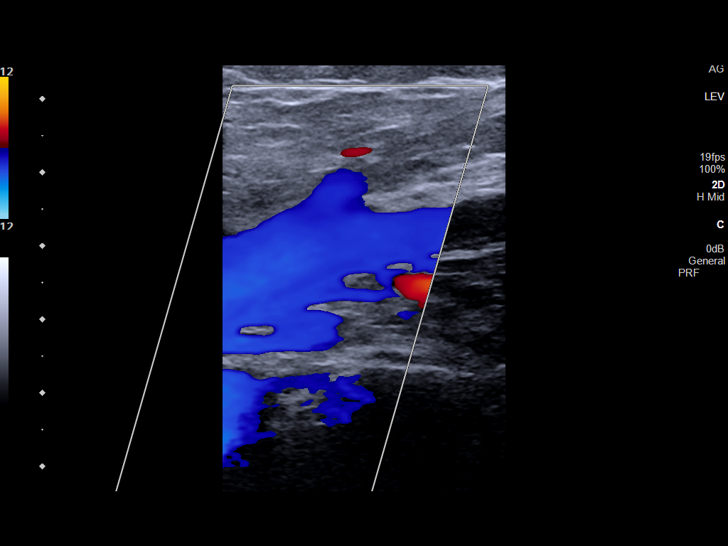
[im 9/33]
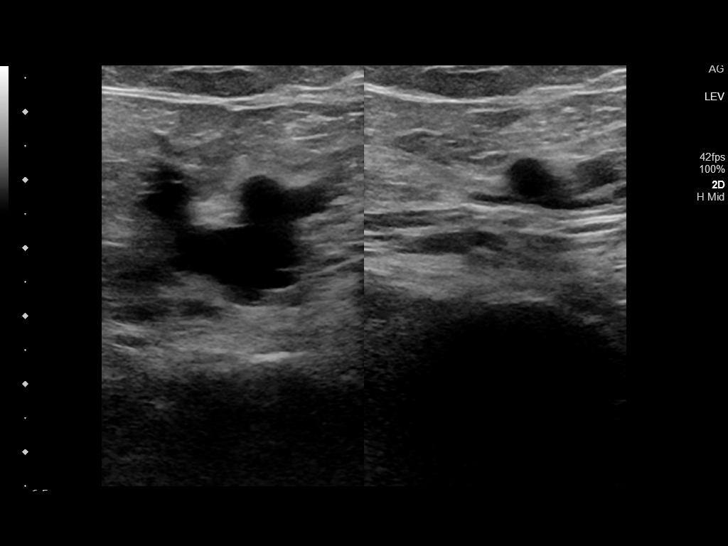
[im 10/33]
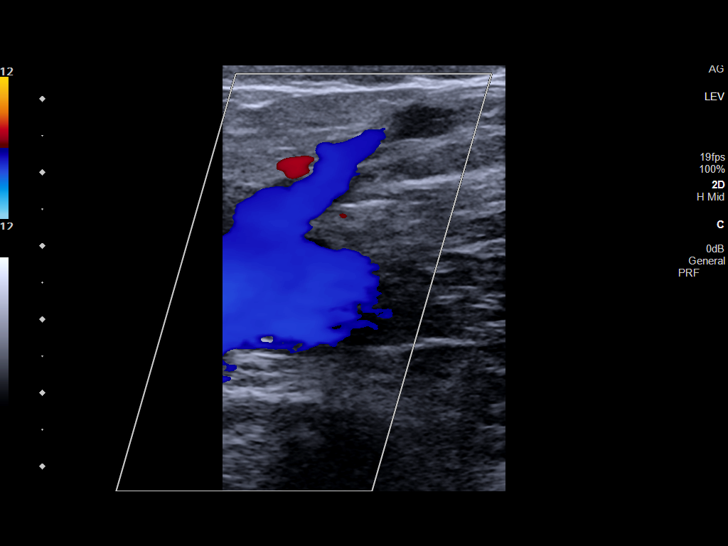
[im 13/33]
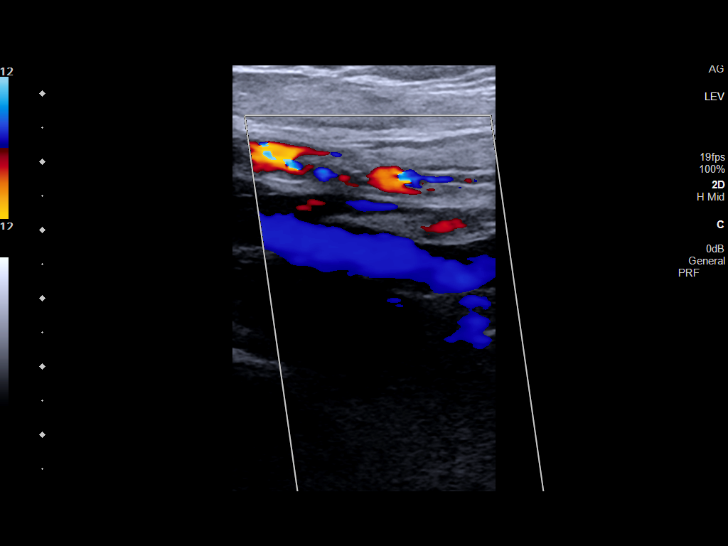
[im 16/33]
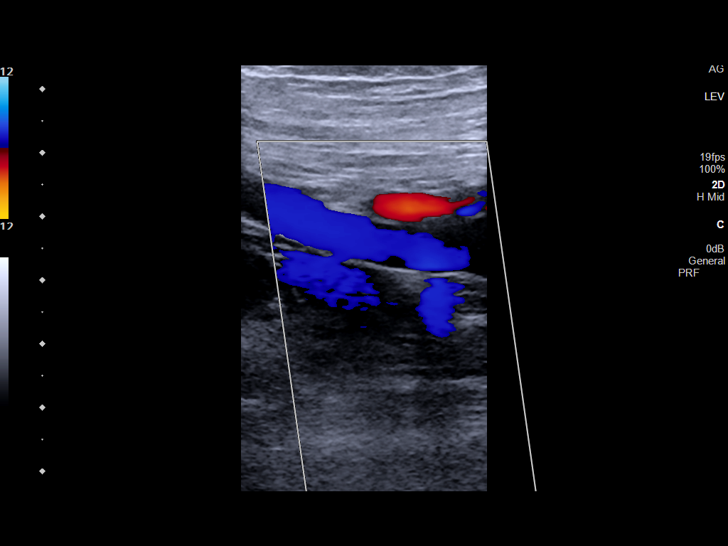
[im 17/33]
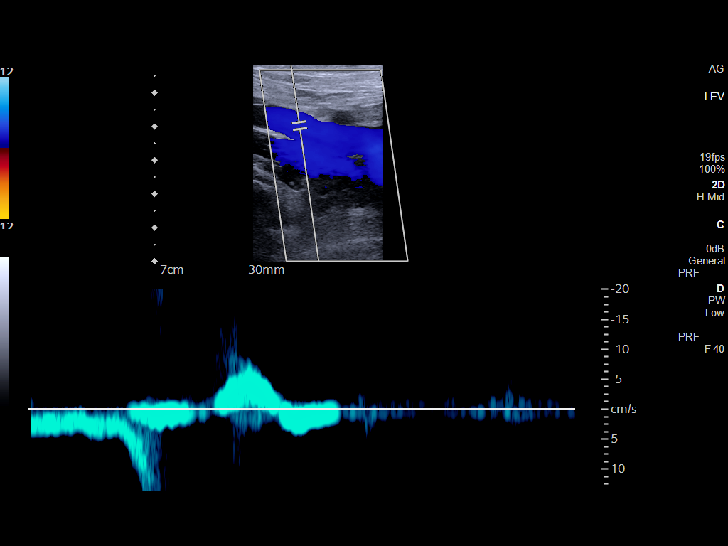
[im 20/33]
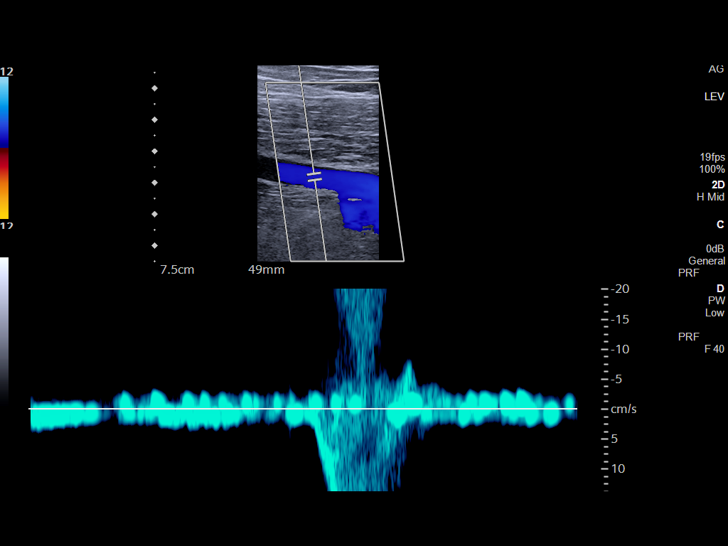
[im 23/33]
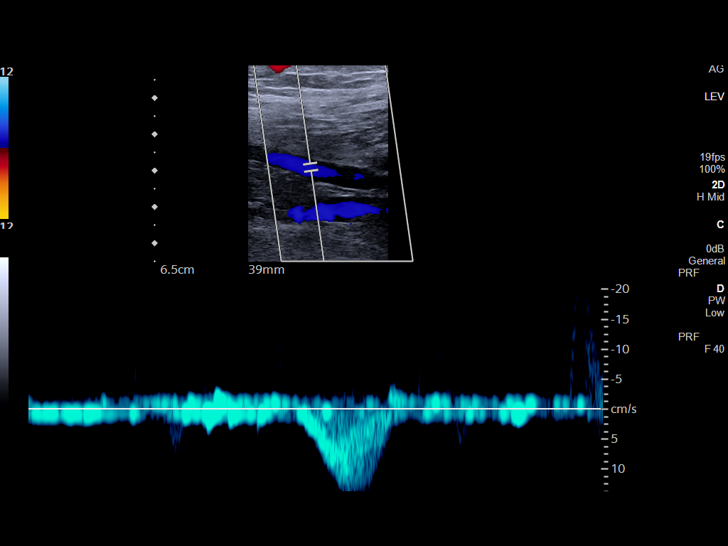
[im 26/33]
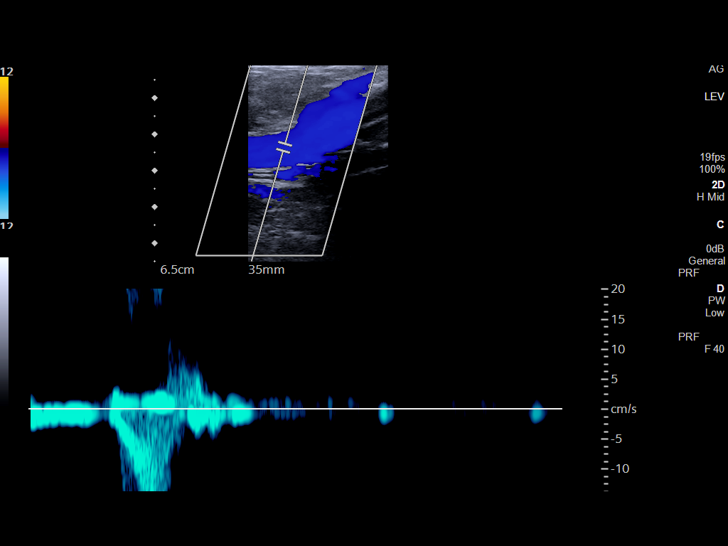
[im 27/33]
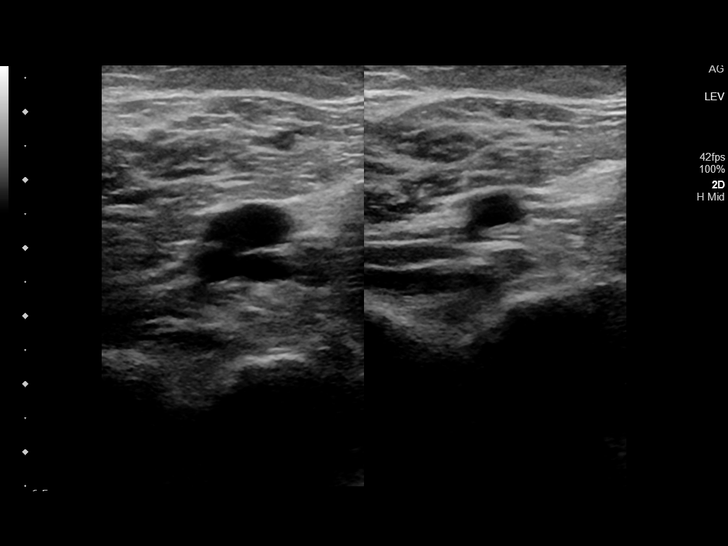
[im 30/33]
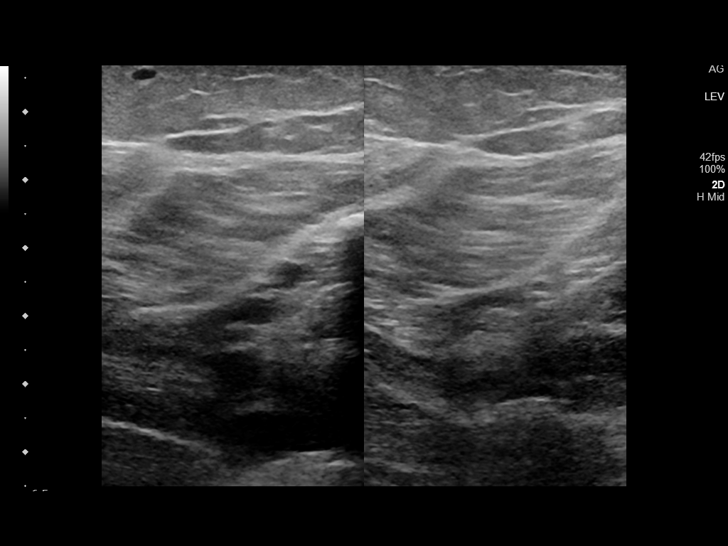
[im 33/33]
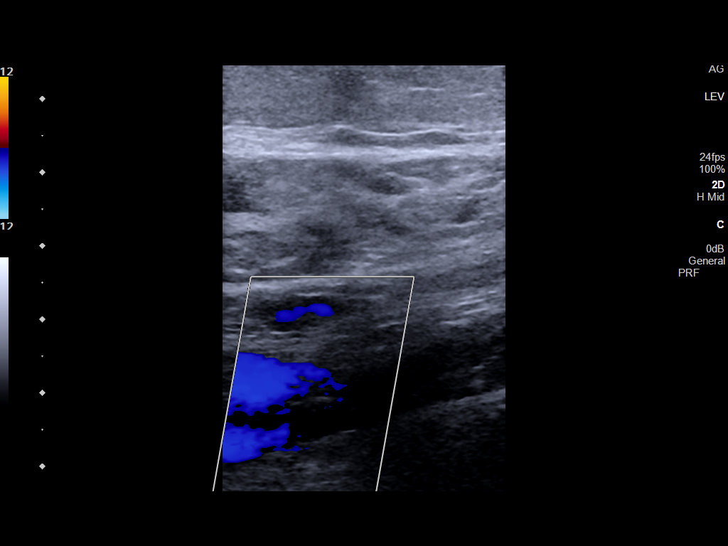

[14 of 24 positions shown; findings below may reference images not displayed]

FINDINGS: VENOUS

Normal compressibility of the common femoral, superficial femoral,
and popliteal veins, as well as the visualized calf veins.
Visualized portions of profunda femoral vein and great saphenous
vein unremarkable. No filling defects to suggest DVT on grayscale or
color Doppler imaging. Doppler waveforms show normal direction of
venous flow, normal respiratory plasticity and response to
augmentation.

Limited views of the contralateral common femoral vein are
unremarkable.

OTHER

None.

Limitations: none
IMPRESSION: 1. No findings of left lower extremity DVT.

## 2022-11-04 IMAGING — US US EXTREM LOW ARTERIAL SEG MULTIPLE*L*
1 series · 14 of 22 positions shown · non-contrast
Comparison: None.

CLINICAL DATA: Claudication

EXAM:
LEFT LOWER EXTREMITY ARTERIAL DUPLEX SCAN
TECHNIQUE: Gray-scale sonography as well as color Doppler and duplex ultrasound
was performed to evaluate the lower extremity arteries including the
common, superficial and profunda femoral arteries, popliteal artery
and calf arteries.

[Series 1: us arterial lower extremity duplex left (non-abi) · arterial · 14 of 22 slices shown]
[im 1/22]
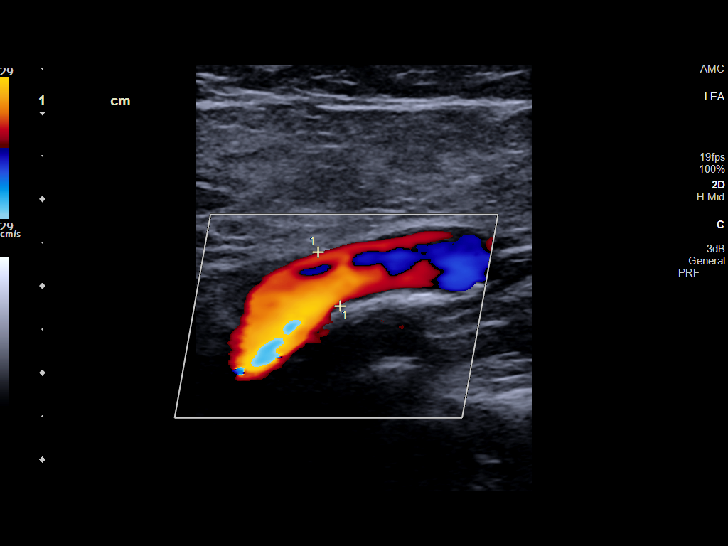
[im 3/22]
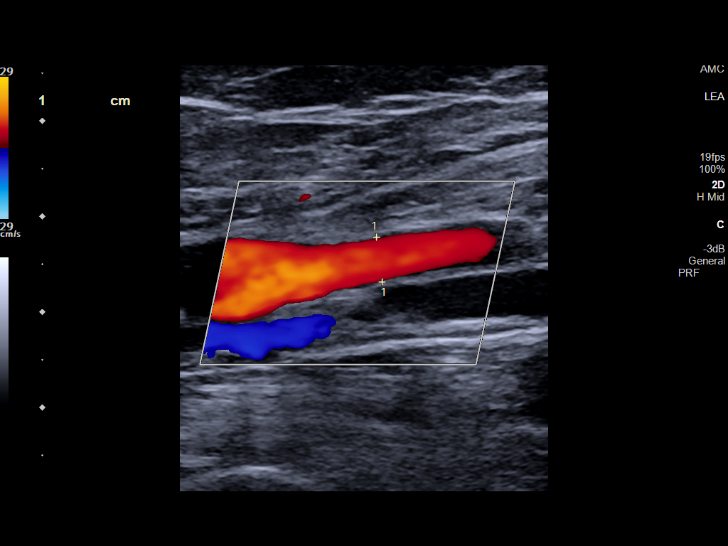
[im 4/22]
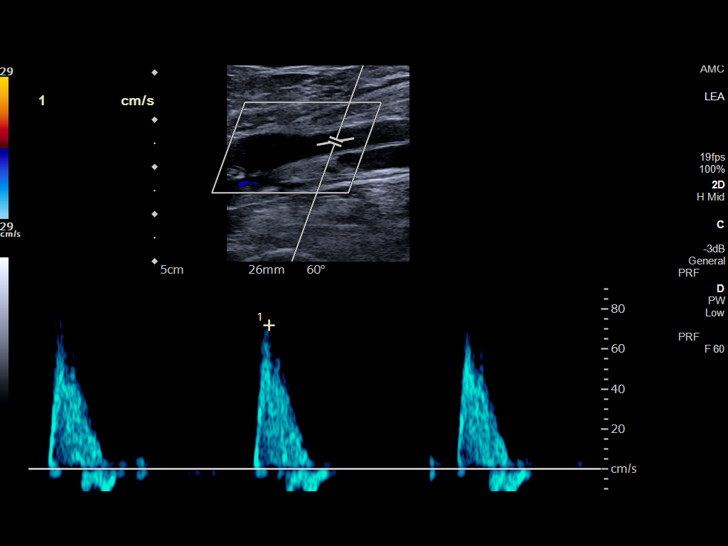
[im 6/22]
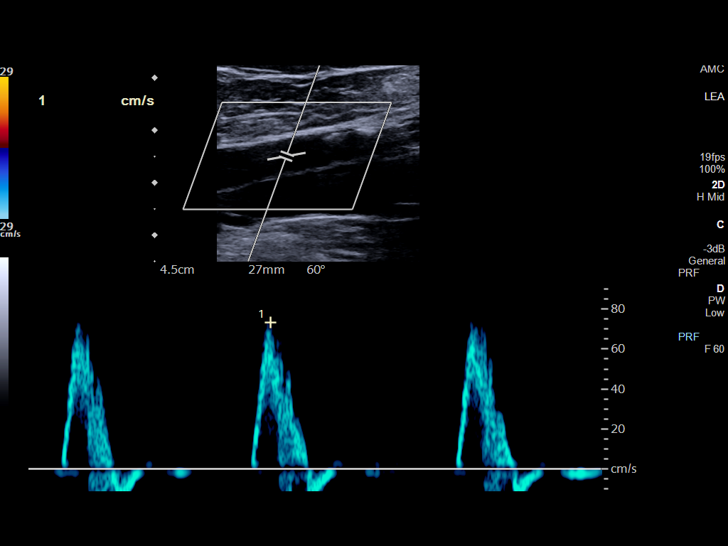
[im 8/22]
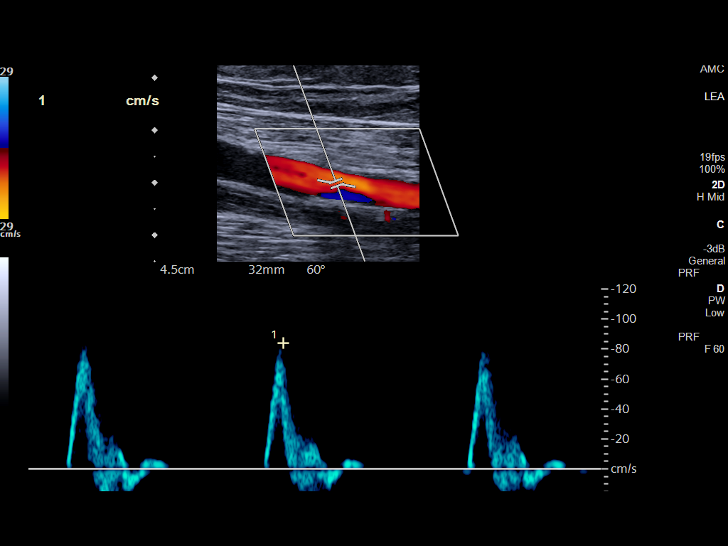
[im 9/22]
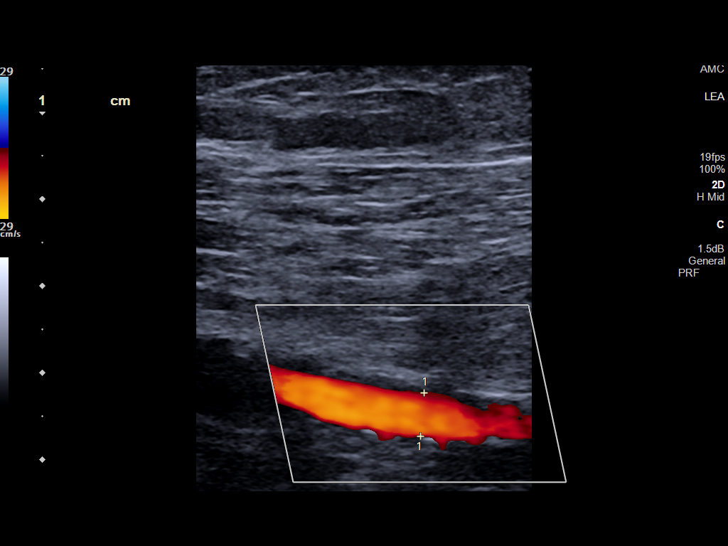
[im 11/22]
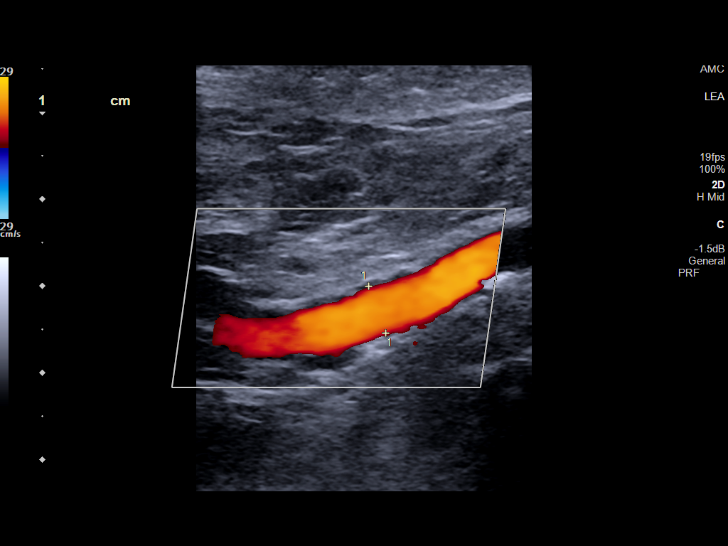
[im 12/22]
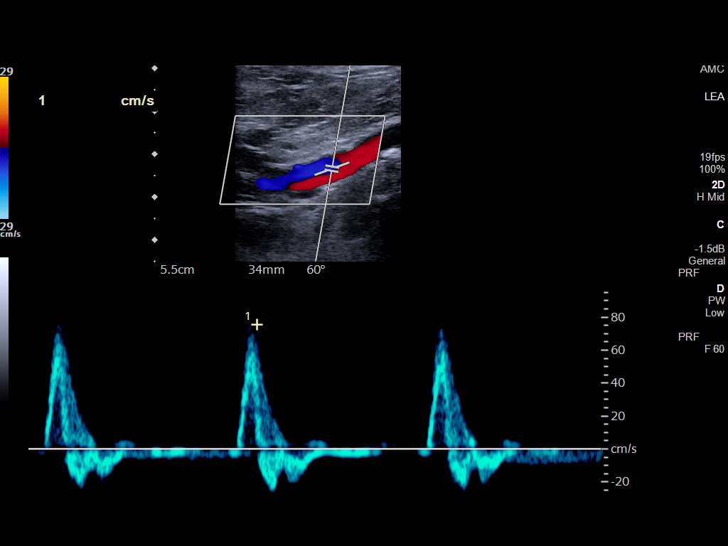
[im 14/22]
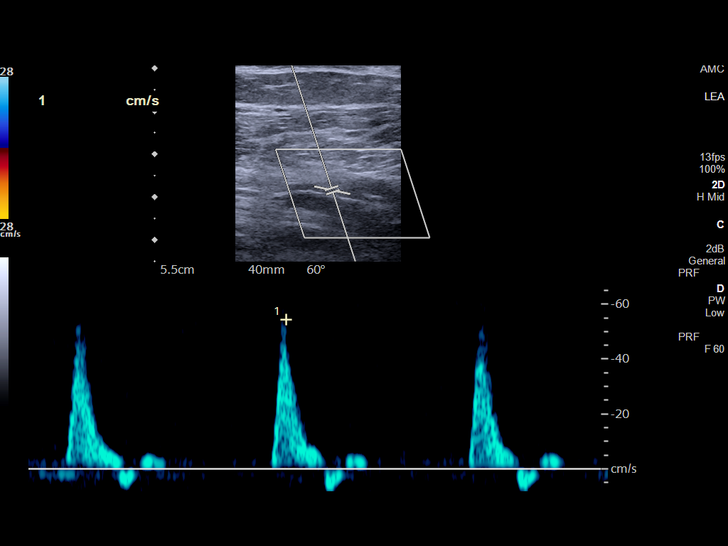
[im 15/22]
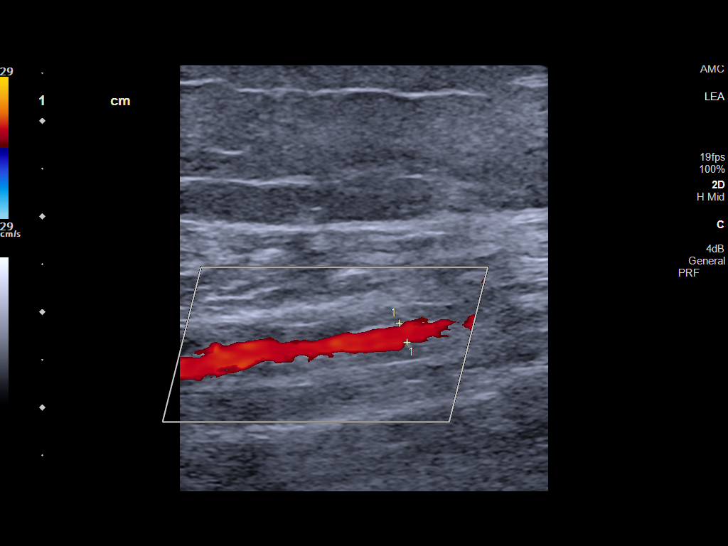
[im 17/22]
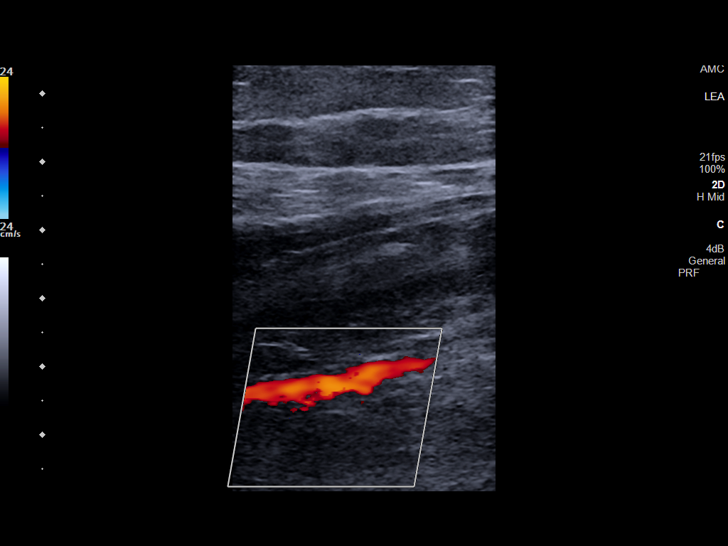
[im 19/22]
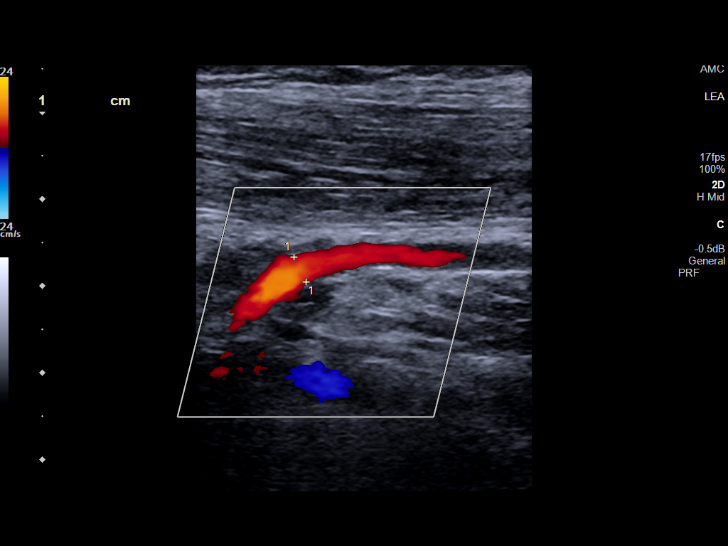
[im 20/22]
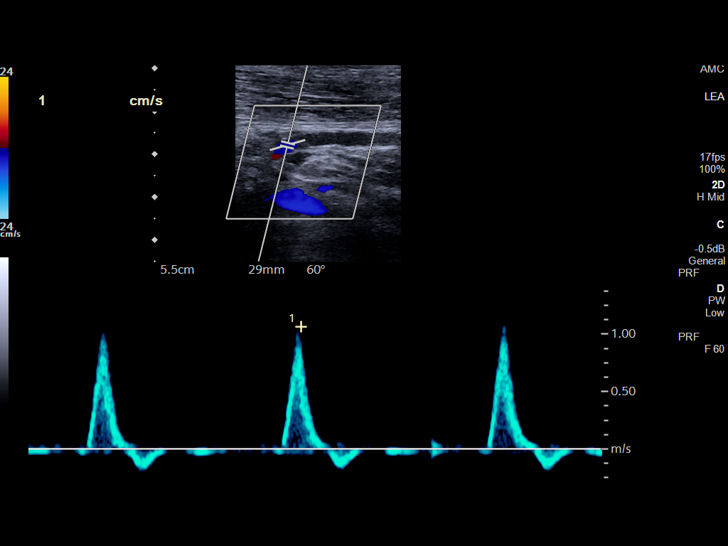
[im 22/22]
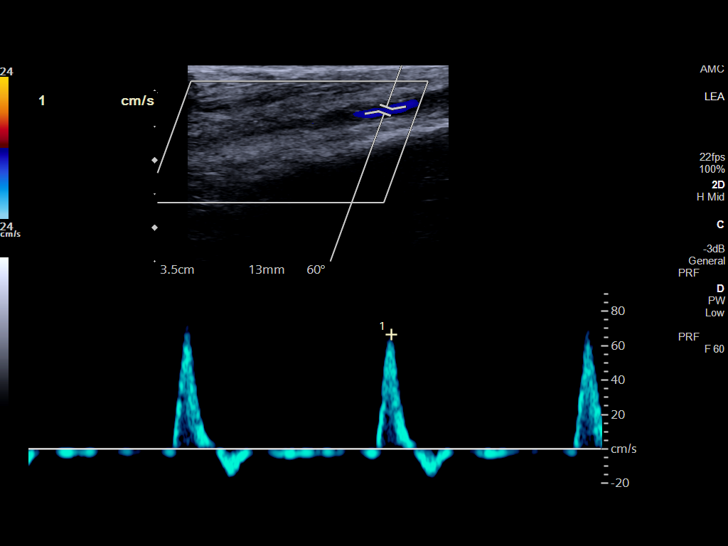

[14 of 22 positions shown; findings below may reference images not displayed]

FINDINGS: Left lower Extremity

Inflow: Normal common femoral arterial waveforms and velocities. No
evidence of inflow (aortoiliac) disease.

Outflow: Normal profunda femoral, superficial femoral and popliteal
arterial waveforms and velocities. No focal elevation of the PSV to
suggest stenosis.

Runoff: Normal posterior and anterior tibial arterial waveforms and
velocities. Vessels are patent to the ankle.

posterior and anterior tibial arterial waveforms and velocities.
Vessels are patent to the ankle.
IMPRESSION: Mild scattered atherosclerotic plaque without hemodynamically
significant stenosis or occlusion.

## 2023-05-17 ENCOUNTER — Other Ambulatory Visit: Payer: Self-pay | Admitting: Physician Assistant

## 2023-05-17 DIAGNOSIS — Z1231 Encounter for screening mammogram for malignant neoplasm of breast: Secondary | ICD-10-CM

## 2023-06-13 ENCOUNTER — Ambulatory Visit
Admission: RE | Admit: 2023-06-13 | Discharge: 2023-06-13 | Disposition: A | Discharge status: Home / Self Care | Payer: Medicare Other | Source: Ambulatory Visit | Attending: Physician Assistant | Admitting: Physician Assistant

## 2023-06-13 DIAGNOSIS — Z1231 Encounter for screening mammogram for malignant neoplasm of breast: Secondary | ICD-10-CM | POA: Diagnosis present

## 2024-05-14 ENCOUNTER — Other Ambulatory Visit: Payer: Self-pay | Admitting: Family Medicine

## 2024-05-14 DIAGNOSIS — Z1231 Encounter for screening mammogram for malignant neoplasm of breast: Secondary | ICD-10-CM

## 2024-06-16 ENCOUNTER — Ambulatory Visit
Admission: RE | Admit: 2024-06-16 | Discharge: 2024-06-16 | Disposition: A | Discharge status: Home / Self Care | Source: Ambulatory Visit | Attending: Family Medicine | Admitting: Family Medicine

## 2024-06-16 DIAGNOSIS — Z1231 Encounter for screening mammogram for malignant neoplasm of breast: Secondary | ICD-10-CM | POA: Diagnosis present
